# Patient Record
Sex: Female | Born: 1955 | Race: White | Hispanic: No | Marital: Married | Smoking: Former smoker
Health system: Southern US, Community
[De-identification: ages and names within clinical notes are randomized; demographics above are authoritative.]

## PROBLEM LIST (undated history)

## (undated) DIAGNOSIS — I10 Essential (primary) hypertension: Secondary | ICD-10-CM

## (undated) DIAGNOSIS — E785 Hyperlipidemia, unspecified: Secondary | ICD-10-CM

## (undated) DIAGNOSIS — E119 Type 2 diabetes mellitus without complications: Secondary | ICD-10-CM

## (undated) HISTORY — PX: TOTAL VAGINAL HYSTERECTOMY: SHX2548

## (undated) HISTORY — DX: Essential (primary) hypertension: I10

## (undated) HISTORY — PX: CHOLECYSTECTOMY: SHX55

## (undated) HISTORY — DX: Hyperlipidemia, unspecified: E78.5

## (undated) HISTORY — PX: ABDOMINAL HYSTERECTOMY: SHX81

## (undated) HISTORY — DX: Type 2 diabetes mellitus without complications: E11.9

---

## 2000-10-09 ENCOUNTER — Encounter: Payer: Self-pay | Admitting: *Deleted

## 2000-10-09 ENCOUNTER — Emergency Department (HOSPITAL_COMMUNITY): Admission: EM | Admit: 2000-10-09 | Discharge: 2000-10-09 | Payer: Self-pay | Admitting: *Deleted

## 2001-12-03 ENCOUNTER — Encounter: Payer: Self-pay | Admitting: Family Medicine

## 2001-12-03 ENCOUNTER — Ambulatory Visit (HOSPITAL_COMMUNITY): Admission: RE | Admit: 2001-12-03 | Discharge: 2001-12-03 | Payer: Self-pay | Admitting: Family Medicine

## 2002-06-02 ENCOUNTER — Ambulatory Visit (HOSPITAL_COMMUNITY): Admission: RE | Admit: 2002-06-02 | Discharge: 2002-06-02 | Payer: Self-pay | Admitting: Orthopaedic Surgery

## 2002-06-02 ENCOUNTER — Encounter: Payer: Self-pay | Admitting: Orthopaedic Surgery

## 2003-08-11 ENCOUNTER — Ambulatory Visit (HOSPITAL_COMMUNITY): Admission: RE | Admit: 2003-08-11 | Discharge: 2003-08-11 | Payer: Self-pay | Admitting: Family Medicine

## 2004-08-29 ENCOUNTER — Ambulatory Visit (HOSPITAL_COMMUNITY): Admission: RE | Admit: 2004-08-29 | Discharge: 2004-08-29 | Payer: Self-pay | Admitting: Family Medicine

## 2004-09-04 ENCOUNTER — Ambulatory Visit (HOSPITAL_COMMUNITY): Admission: RE | Admit: 2004-09-04 | Discharge: 2004-09-04 | Payer: Self-pay | Admitting: Family Medicine

## 2005-10-16 ENCOUNTER — Ambulatory Visit (HOSPITAL_COMMUNITY): Admission: RE | Admit: 2005-10-16 | Discharge: 2005-10-16 | Payer: Self-pay | Admitting: Family Medicine

## 2006-09-23 ENCOUNTER — Ambulatory Visit: Payer: Self-pay | Admitting: Orthopedic Surgery

## 2006-10-21 ENCOUNTER — Ambulatory Visit (HOSPITAL_COMMUNITY): Admission: RE | Admit: 2006-10-21 | Discharge: 2006-10-21 | Payer: Self-pay | Admitting: Unknown Physician Specialty

## 2007-10-07 DIAGNOSIS — I10 Essential (primary) hypertension: Secondary | ICD-10-CM | POA: Insufficient documentation

## 2007-10-07 DIAGNOSIS — Z8679 Personal history of other diseases of the circulatory system: Secondary | ICD-10-CM

## 2007-10-08 ENCOUNTER — Ambulatory Visit: Payer: Self-pay | Admitting: Orthopedic Surgery

## 2007-10-08 DIAGNOSIS — M7150 Other bursitis, not elsewhere classified, unspecified site: Secondary | ICD-10-CM

## 2007-11-19 ENCOUNTER — Ambulatory Visit (HOSPITAL_COMMUNITY): Admission: RE | Admit: 2007-11-19 | Discharge: 2007-11-19 | Payer: Self-pay | Admitting: Family Medicine

## 2008-12-06 ENCOUNTER — Ambulatory Visit (HOSPITAL_COMMUNITY): Admission: RE | Admit: 2008-12-06 | Discharge: 2008-12-06 | Payer: Self-pay | Admitting: Family Medicine

## 2009-06-28 ENCOUNTER — Ambulatory Visit (HOSPITAL_COMMUNITY): Admission: RE | Admit: 2009-06-28 | Discharge: 2009-06-28 | Payer: Self-pay | Admitting: General Surgery

## 2010-07-08 ENCOUNTER — Encounter: Payer: Self-pay | Admitting: Family Medicine

## 2010-08-25 ENCOUNTER — Other Ambulatory Visit (HOSPITAL_COMMUNITY): Payer: Self-pay | Admitting: Family Medicine

## 2010-08-25 DIAGNOSIS — Z139 Encounter for screening, unspecified: Secondary | ICD-10-CM

## 2010-08-31 ENCOUNTER — Ambulatory Visit (HOSPITAL_COMMUNITY)
Admission: RE | Admit: 2010-08-31 | Discharge: 2010-08-31 | Disposition: A | Discharge status: Home / Self Care | Payer: BC Managed Care – PPO | Source: Ambulatory Visit | Attending: Family Medicine | Admitting: Family Medicine

## 2010-08-31 DIAGNOSIS — Z1231 Encounter for screening mammogram for malignant neoplasm of breast: Secondary | ICD-10-CM | POA: Insufficient documentation

## 2010-08-31 DIAGNOSIS — Z139 Encounter for screening, unspecified: Secondary | ICD-10-CM

## 2010-09-02 LAB — CLOTEST (H. PYLORI), BIOPSY: Helicobacter screen: POSITIVE — AB

## 2011-09-13 ENCOUNTER — Other Ambulatory Visit (HOSPITAL_COMMUNITY): Payer: Self-pay | Admitting: Family Medicine

## 2011-09-13 DIAGNOSIS — Z139 Encounter for screening, unspecified: Secondary | ICD-10-CM

## 2011-10-15 ENCOUNTER — Ambulatory Visit (HOSPITAL_COMMUNITY)
Admission: RE | Admit: 2011-10-15 | Discharge: 2011-10-15 | Disposition: A | Discharge status: Home / Self Care | Payer: BC Managed Care – PPO | Source: Ambulatory Visit | Attending: Family Medicine | Admitting: Family Medicine

## 2011-10-15 DIAGNOSIS — Z139 Encounter for screening, unspecified: Secondary | ICD-10-CM

## 2011-10-15 DIAGNOSIS — Z1231 Encounter for screening mammogram for malignant neoplasm of breast: Secondary | ICD-10-CM | POA: Insufficient documentation

## 2011-11-22 ENCOUNTER — Ambulatory Visit: Payer: BC Managed Care – PPO | Admitting: Orthopedic Surgery

## 2013-03-24 ENCOUNTER — Ambulatory Visit: Payer: BC Managed Care – PPO | Admitting: Urology

## 2013-03-31 ENCOUNTER — Ambulatory Visit (INDEPENDENT_AMBULATORY_CARE_PROVIDER_SITE_OTHER): Payer: BC Managed Care – PPO | Admitting: Urology

## 2013-03-31 ENCOUNTER — Encounter (INDEPENDENT_AMBULATORY_CARE_PROVIDER_SITE_OTHER): Payer: Self-pay

## 2013-03-31 DIAGNOSIS — N390 Urinary tract infection, site not specified: Secondary | ICD-10-CM

## 2013-06-23 ENCOUNTER — Ambulatory Visit: Payer: BC Managed Care – PPO | Admitting: Urology

## 2013-06-30 ENCOUNTER — Ambulatory Visit (INDEPENDENT_AMBULATORY_CARE_PROVIDER_SITE_OTHER): Payer: BC Managed Care – PPO | Admitting: Urology

## 2013-06-30 DIAGNOSIS — N390 Urinary tract infection, site not specified: Secondary | ICD-10-CM

## 2016-10-15 DIAGNOSIS — E663 Overweight: Secondary | ICD-10-CM | POA: Insufficient documentation

## 2016-10-15 DIAGNOSIS — Z6829 Body mass index (BMI) 29.0-29.9, adult: Secondary | ICD-10-CM | POA: Insufficient documentation

## 2016-10-15 DIAGNOSIS — E782 Mixed hyperlipidemia: Secondary | ICD-10-CM | POA: Insufficient documentation

## 2016-10-15 DIAGNOSIS — E781 Pure hyperglyceridemia: Secondary | ICD-10-CM | POA: Insufficient documentation

## 2016-10-16 ENCOUNTER — Ambulatory Visit (INDEPENDENT_AMBULATORY_CARE_PROVIDER_SITE_OTHER): Payer: BLUE CROSS/BLUE SHIELD | Admitting: Cardiology

## 2016-10-16 ENCOUNTER — Encounter: Payer: Self-pay | Admitting: Cardiology

## 2016-10-16 VITALS — BP 154/88 | HR 95 | Ht 66.5 in | Wt 185.0 lb

## 2016-10-16 DIAGNOSIS — Z136 Encounter for screening for cardiovascular disorders: Secondary | ICD-10-CM

## 2016-10-16 DIAGNOSIS — E782 Mixed hyperlipidemia: Secondary | ICD-10-CM | POA: Diagnosis not present

## 2016-10-16 DIAGNOSIS — E119 Type 2 diabetes mellitus without complications: Secondary | ICD-10-CM

## 2016-10-16 NOTE — Progress Notes (Signed)
Clinical Summary Marie Bell is a 61 y.o.female seen as new patient, she is referred by Dr Phillips Odor for hyperlipidemia.   1. Hyperlipidemia 02/2016 Cholesterol: TC 248 HDL 43 LDL  Triglycerides 657 TSH 1.5 09/2016 TC 270 TG 1556 HDL 36 - tried simvastatin with side effects. - she reports has not had fasting labs.    2. DM2 - followed by pcp, last HgbA1c was 7.    Past Medical History:  Diagnosis Date  . DM (diabetes mellitus) (HCC)   . Hyperlipidemia   . Hypertension      Allergies  Allergen Reactions  . Metformin And Related Diarrhea    "extreme" diarrhea  . Simvastatin Other (See Comments)    Leg cramps  . Penicillins   . Augmentin [Amoxicillin-Pot Clavulanate] Rash  . Cyclobenzaprine Palpitations    Body aches,nausea,weakness  . Sulfa Antibiotics Rash     Current Outpatient Prescriptions  Medication Sig Dispense Refill  . aspirin EC 81 MG tablet Take 81 mg by mouth daily.    . bisoprolol-hydrochlorothiazide (ZIAC) 2.5-6.25 MG tablet Take 1 tablet by mouth daily.    Marland Kitchen losartan (COZAAR) 50 MG tablet Take 50 mg by mouth daily.    . metFORMIN (GLUCOPHAGE) 500 MG tablet Take by mouth 3 (three) times daily.     No current facility-administered medications for this visit.      Past Surgical History:  Procedure Laterality Date  . CHOLECYSTECTOMY    . TOTAL VAGINAL HYSTERECTOMY       Allergies  Allergen Reactions  . Metformin And Related Diarrhea    "extreme" diarrhea  . Simvastatin Other (See Comments)    Leg cramps  . Penicillins   . Augmentin [Amoxicillin-Pot Clavulanate] Rash  . Cyclobenzaprine Palpitations    Body aches,nausea,weakness  . Sulfa Antibiotics Rash      Family History  Problem Relation Age of Onset  . Hypertension Mother   . Arthritis Mother   . Liver disease Father   . Alcoholism Father   . Diabetes Maternal Grandmother   . Colon cancer Maternal Grandfather      Social History Marie Bell reports that she has never  smoked. She has never used smokeless tobacco. Marie Bell reports that she does not drink alcohol.   Review of Systems CONSTITUTIONAL: No weight loss, fever, chills, weakness or fatigue.  HEENT: Eyes: No visual loss, blurred vision, double vision or yellow sclerae.No hearing loss, sneezing, congestion, runny nose or sore throat.  SKIN: No rash or itching.  CARDIOVASCULAR: no chest pain , no palpitations.  RESPIRATORY: No shortness of breath, cough or sputum.  GASTROINTESTINAL: No anorexia, nausea, vomiting or diarrhea. No abdominal pain or blood.  GENITOURINARY: No burning on urination, no polyuria NEUROLOGICAL: No headache, dizziness, syncope, paralysis, ataxia, numbness or tingling in the extremities. No change in bowel or bladder control.  MUSCULOSKELETAL: No muscle, back pain, joint pain or stiffness.  LYMPHATICS: No enlarged nodes. No history of splenectomy.  PSYCHIATRIC: No history of depression or anxiety.  ENDOCRINOLOGIC: No reports of sweating, cold or heat intolerance. No polyuria or polydipsia.  Marland Kitchen   Physical Examination Vitals:   10/16/16 1338  BP: (!) 154/88  Pulse: 95   Vitals:   10/16/16 1338  Weight: 185 lb (83.9 kg)  Height: 5' 6.5" (1.689 m)    Gen: resting comfortably, no acute distress HEENT: no scleral icterus, pupils equal round and reactive, no palptable cervical adenopathy,  CV: RRR, no m/r/g, no jvd Resp: Clear to auscultation bilaterally  GI: abdomen is soft, non-tender, non-distended, normal bowel sounds, no hepatosplenomegaly MSK: extremities are warm, no edema.  Skin: warm, no rash Neuro:  no focal deficits Psych: appropriate affect   EKG in clinic today shows NSR.   Assessment and Plan  1. Hyperlipidemia - we will repeat her lipid panel. She reports prior labs have not been fasting. Unclear how severe her hypertriglyceridemia is - pending results, will consider fenofibrate, potentially lovaza in the near future - counseled on dietary and  lifestyle modification   2. DM2 - controlled, continue oral therapy by pcp      Antoine Poche, MD

## 2016-10-16 NOTE — Patient Instructions (Signed)
Medication Instructions:  Your physician recommends that you continue on your current medications as directed. Please refer to the Current Medication list given to you today.   Labwork: Your physician recommends that you return for lab work in: asap Fasting lipid panel   Testing/Procedures: none  Follow-Up: Your physician recommends that you schedule a follow-up appointment in: 2 months    Any Other Special Instructions Will Be Listed Below (If Applicable).     If you need a refill on your cardiac medications before your next appointment, please call your pharmacy.

## 2016-10-22 ENCOUNTER — Other Ambulatory Visit: Payer: Self-pay | Admitting: Cardiology

## 2016-10-23 LAB — LIPID PANEL W/O CHOL/HDL RATIO
Cholesterol, Total: 419 mg/dL — ABNORMAL HIGH (ref 100–199)
HDL: 27 mg/dL — AB (ref >39)
Triglycerides: 2106 mg/dL (ref 0–149)

## 2016-10-29 ENCOUNTER — Telehealth: Payer: Self-pay | Admitting: *Deleted

## 2016-10-29 MED ORDER — FENOFIBRATE 145 MG PO TABS: 145.0000 mg | ORAL_TABLET | Freq: Every day | ORAL | 3 refills | Status: DC

## 2016-10-29 MED ORDER — OMEGA-3-ACID ETHYL ESTERS 1 G PO CAPS: 2.0000 g | ORAL_CAPSULE | Freq: Two times a day (BID) | ORAL | 3 refills | Status: DC

## 2016-10-29 NOTE — Telephone Encounter (Signed)
Patient notified

## 2016-10-29 NOTE — Telephone Encounter (Signed)
-----   Message from Albertine PatriciaStaci T Ashworth, CMA sent at 10/26/2016  3:53 PM EDT -----   ----- Message ----- From: Antoine PocheBranch, Jonathan F, MD Sent: 10/26/2016   3:47 PM To: Staci T Ashworth, CMA  Triglycerides are very high. Please make sure this is a true fasting lab  (no food for at least 8 hours prior, including mild or coffee creamer). If it is true fasting, please start tricor (fenofibrate) 145mg  daily as well as lovaza 2g bid. Why is she on estradiol? Which doctor has been prescirbing this for her? This could be playing some role in her really high TGs and would like to get her off if possible once we get more info about it. Id like to see her back in about 3 weeks to discuss everything.   Dina RichJonathan Branch MD

## 2016-10-29 NOTE — Telephone Encounter (Signed)
Please start new medications as prescribed. I would have her stop the estradiol for now and have her arrange an outpatient f/u with Dr Rana SnareLowe to discuss alternative treatments. I think her very high triglycerides at this time are the priority, at this level she is at risk for something called pancreatitis which we want to avoid. Strongly cut back on fatty foods, sweets, sodas, teas  J Danijela Vessey MD

## 2016-10-29 NOTE — Telephone Encounter (Signed)
Patient currently out of town. She is on Estradiol for vaginal dryness. This was [facility] She reports fasting before labs for 8 1/2 hours.

## 2016-11-21 ENCOUNTER — Telehealth: Payer: Self-pay

## 2016-11-21 NOTE — Telephone Encounter (Signed)
Spoke with patient. She has been taking the midications for about 3 weeks ( fenofibrate & lovaza) with no problems. She will make her follow up appointment soon.

## 2016-11-21 NOTE — Telephone Encounter (Signed)
-----   Message from Antoine PocheJonathan F Branch, MD sent at 11/21/2016  9:46 AM EDT ----- Can we touch base with this patient and make sure she was able to start her fenofibrate and lovaza  Dominga FerryJ Branch MD

## 2016-12-27 ENCOUNTER — Encounter: Payer: Self-pay | Admitting: Cardiology

## 2016-12-27 ENCOUNTER — Ambulatory Visit (INDEPENDENT_AMBULATORY_CARE_PROVIDER_SITE_OTHER): Payer: BLUE CROSS/BLUE SHIELD | Admitting: Cardiology

## 2016-12-27 VITALS — BP 140/82 | HR 74 | Ht 66.0 in | Wt 183.0 lb

## 2016-12-27 DIAGNOSIS — E782 Mixed hyperlipidemia: Secondary | ICD-10-CM | POA: Diagnosis not present

## 2016-12-27 NOTE — Progress Notes (Signed)
Clinical Summary [name] is a 61 y.o.female seen today for follow up of the following medical problems.   1. Hyperlipidemia 02/2016 Cholesterol: TC 248 HDL 43 LDL  Triglycerides 161741 TSH 1.5 - 09/2016 TC 270 TG 1556 HDL 36 - on repeat labs TGs up to 2100. - started on fenofibrate and lovaza, she started taking May 21 - had been on estradiol at the time, she has stopped taking. - working on dietary changes and weight loss  2. DM2 - followed by pcp, last HgbA1c was 7.    SH: [place] Past Medical History:  Diagnosis Date  . DM (diabetes mellitus) (HCC)   . Hyperlipidemia   . Hypertension      Allergies  Allergen Reactions  . Metformin And Related Diarrhea    "extreme" diarrhea  . Simvastatin Other (See Comments)    Leg cramps  . Penicillins   . Augmentin [Amoxicillin-Pot Clavulanate] Rash  . Cyclobenzaprine Palpitations    Body aches,nausea,weakness  . Sulfa Antibiotics Rash     Current Outpatient Prescriptions  Medication Sig Dispense Refill  . aspirin EC 81 MG tablet Take 81 mg by mouth daily.    . bisoprolol-hydrochlorothiazide (ZIAC) 2.5-6.25 MG tablet Take 1 tablet by mouth daily.    Marland Kitchen. estradiol (ESTRACE) 1 MG tablet Take 1 mg by mouth daily.  0  . fenofibrate (TRICOR) 145 MG tablet Take 1 tablet (145 mg total) by mouth daily. 90 tablet 3  . losartan (COZAAR) 50 MG tablet Take 50 mg by mouth daily.    . metFORMIN (GLUCOPHAGE) 500 MG tablet Take by mouth 3 (three) times daily.    Marland Kitchen. omega-3 acid ethyl esters (LOVAZA) 1 g capsule Take 2 capsules (2 g total) by mouth 2 (two) times daily. 180 capsule 3   No current facility-administered medications for this visit.      Past Surgical History:  Procedure Laterality Date  . CHOLECYSTECTOMY    . TOTAL VAGINAL HYSTERECTOMY       Allergies  Allergen Reactions  . Metformin And Related Diarrhea    "extreme" diarrhea  . Simvastatin Other (See Comments)    Leg cramps    . Penicillins   . Augmentin [Amoxicillin-Pot Clavulanate] Rash  . Cyclobenzaprine Palpitations    Body aches,nausea,weakness  . Sulfa Antibiotics Rash      Family History  Problem Relation Age of Onset  . Hypertension Mother   . Arthritis Mother   . Liver disease Father   . Alcoholism Father   . Diabetes Maternal Grandmother   . Colon cancer Maternal Grandfather      Social History [name] reports that she quit smoking about 10 years ago. She has never used smokeless tobacco. [name] reports that she does not drink alcohol.   Review of Systems CONSTITUTIONAL: No weight loss, fever, chills, weakness or fatigue.  HEENT: Eyes: No visual loss, blurred vision, double vision or yellow sclerae.No hearing loss, sneezing, congestion, runny nose or sore throat.  SKIN: No rash or itching.  CARDIOVASCULAR: no chest pain, no palpitations.  RESPIRATORY: No shortness of breath, cough or sputum.  GASTROINTESTINAL: No anorexia, nausea, vomiting or diarrhea. No abdominal pain or blood.  GENITOURINARY: No burning on urination, no polyuria NEUROLOGICAL: No headache, dizziness, syncope, paralysis, ataxia, numbness or tingling in the extremities. No change in bowel or bladder control.  MUSCULOSKELETAL: No muscle, back pain, joint pain or stiffness.  LYMPHATICS: No enlarged nodes. No history of splenectomy.  PSYCHIATRIC: No history of depression or anxiety.  ENDOCRINOLOGIC: No reports of sweating, cold or heat intolerance. No polyuria or polydipsia.  Marland Kitchen   Physical Examination Vitals:   12/27/16 1324  BP: 140/82  Pulse: 74   Vitals:   12/27/16 1324  Weight: 183 lb (83 kg)  Height: 5\' 6"  (1.676 m)    Gen: resting comfortably, no acute distress HEENT: no scleral icterus, pupils equal round and reactive, no palptable cervical adenopathy,  CV: RRR, no m/r/g, no jvd Resp: Clear to auscultation bilaterally GI: abdomen is soft, non-tender, non-distended, normal bowel sounds, no  hepatosplenomegaly MSK: extremities are warm, no edema.  Skin: warm, no rash Neuro:  no focal deficits Psych: appropriate affect   Diagnostic Studies     Assessment and Plan  1. Hyperlipidemia - we will repeat labs - continue current meds       Antoine Poche, M.D.

## 2016-12-27 NOTE — Patient Instructions (Signed)
Your physician wants you to follow-up in:  6 months with Dr Lurena JoinerBranch You will receive a reminder letter in the mail two months in advance. If you don't receive a letter, please call our office to schedule the follow-up appointment.    Your physician recommends that you continue on your current medications as directed. Please refer to the Current Medication list given to you today.     Your physician recommends that you return for lab work in: lipids, tsh     Thank you for choosing [facility] !

## 2017-01-21 ENCOUNTER — Other Ambulatory Visit: Payer: Self-pay | Admitting: Cardiology

## 2017-01-22 LAB — LIPID PANEL W/O CHOL/HDL RATIO
Cholesterol, Total: 197 mg/dL (ref 100–199)
HDL: 49 mg/dL (ref >39)
LDL Calculated: 121 mg/dL — ABNORMAL HIGH (ref 0–99)
Triglycerides: 134 mg/dL (ref 0–149)
VLDL CHOLESTEROL CAL: 27 mg/dL (ref 5–40)

## 2017-01-22 LAB — TSH: TSH: 3.15 u[IU]/mL (ref 0.450–4.500)

## 2017-05-23 ENCOUNTER — Telehealth: Payer: Self-pay | Admitting: Cardiology

## 2017-05-23 NOTE — Telephone Encounter (Signed)
Patient states she will discuss at visit because she is taking half dose of finofibrate and wants to know what her cholesterol is on reduced dose

## 2017-05-23 NOTE — Telephone Encounter (Signed)
Had lipids and TSH in August, do they need more labs now ?

## 2017-05-23 NOTE — Telephone Encounter (Signed)
No repeat labs needed   Dominga FerryJ Kinzie Wickes MD

## 2017-05-23 NOTE — Telephone Encounter (Signed)
Would like to know if she needs labs prior to her apt on 07/19/17, please give her a call [phone]

## 2017-07-19 ENCOUNTER — Ambulatory Visit (INDEPENDENT_AMBULATORY_CARE_PROVIDER_SITE_OTHER): Payer: BLUE CROSS/BLUE SHIELD | Admitting: Cardiology

## 2017-07-19 ENCOUNTER — Encounter: Payer: Self-pay | Admitting: Cardiology

## 2017-07-19 VITALS — BP 148/90 | HR 77 | Ht 66.0 in | Wt 182.0 lb

## 2017-07-19 DIAGNOSIS — E785 Hyperlipidemia, unspecified: Secondary | ICD-10-CM

## 2017-07-19 DIAGNOSIS — I10 Essential (primary) hypertension: Secondary | ICD-10-CM

## 2017-07-19 NOTE — Progress Notes (Signed)
Clinical Summary Marie Bell is a 62 y.o.female seen today for follow up of the following medical problems.   1. Hyperlipidemia 02/2016 Cholesterol: TC 248 HDL 43 LDL Triglycerides 741 TSH 1.5 - 09/2016 TC 270 TG 1556 HDL 36 - on repeat labs TGs up to 2100. - started on fenofibrate and lovaza, she started taking May 21 - had been on estradiol at the time, she has stopped taking. - working on dietary changes and weight loss   - repeat labs 01/2017 with TC 197 TG 134 HDL 49 LDL 121 - patient has cut down her fenofibrate dose since 01/2017. Takes lovaza every 3-4 days.  - side effects on simvastatin in the past   2. DM2 - followed by pcp, last HgbA1c was 7.   3. HTN - home bp's 140s/70s    SH: recent [place]   Past Medical History:  Diagnosis Date  . DM (diabetes mellitus) (HCC)   . Hyperlipidemia   . Hypertension      Allergies  Allergen Reactions  . Metformin And Related Diarrhea    "extreme" diarrhea  . Simvastatin Other (See Comments)    Leg cramps  . Penicillins   . Augmentin [Amoxicillin-Pot Clavulanate] Rash  . Cyclobenzaprine Palpitations    Body aches,nausea,weakness  . Sulfa Antibiotics Rash     Current Outpatient Medications  Medication Sig Dispense Refill  . aspirin EC 81 MG tablet Take 81 mg by mouth daily.    . bisoprolol-hydrochlorothiazide (ZIAC) 2.5-6.25 MG tablet Take 1 tablet by mouth daily.    . fenofibrate (TRICOR) 145 MG tablet Take 1 tablet (145 mg total) by mouth daily. 90 tablet 3  . losartan (COZAAR) 50 MG tablet Take 50 mg by mouth daily.    . metFORMIN (GLUCOPHAGE) 500 MG tablet Take by mouth 3 (three) times daily.    Marland Kitchen. omega-3 acid ethyl esters (LOVAZA) 1 g capsule Take 2 capsules (2 g total) by mouth 2 (two) times daily. 180 capsule 3   No current facility-administered medications for this visit.      Past Surgical History:  Procedure Laterality Date  . CHOLECYSTECTOMY    . TOTAL  VAGINAL HYSTERECTOMY       Allergies  Allergen Reactions  . Metformin And Related Diarrhea    "extreme" diarrhea  . Simvastatin Other (See Comments)    Leg cramps  . Penicillins   . Augmentin [Amoxicillin-Pot Clavulanate] Rash  . Cyclobenzaprine Palpitations    Body aches,nausea,weakness  . Sulfa Antibiotics Rash      Family History  Problem Relation Age of Onset  . Hypertension Mother   . Arthritis Mother   . Liver disease Father   . Alcoholism Father   . Diabetes Maternal Grandmother   . Colon cancer Maternal Grandfather      Social History Marie Bell reports that she quit smoking about 11 years ago. she has never used smokeless tobacco. Marie Bell reports that she does not drink alcohol.   Review of Systems CONSTITUTIONAL: No weight loss, fever, chills, weakness or fatigue.  HEENT: Eyes: No visual loss, blurred vision, double vision or yellow sclerae.No hearing loss, sneezing, congestion, runny nose or sore throat.  SKIN: No rash or itching.  CARDIOVASCULAR: per hpi RESPIRATORY: No shortness of breath, cough or sputum.  GASTROINTESTINAL: No anorexia, nausea, vomiting or diarrhea. No abdominal pain or blood.  GENITOURINARY: No burning on urination, no polyuria NEUROLOGICAL: No headache, dizziness, syncope, paralysis, ataxia, numbness or tingling in  the extremities. No change in bowel or bladder control.  MUSCULOSKELETAL: No muscle, back pain, joint pain or stiffness.  LYMPHATICS: No enlarged nodes. No history of splenectomy.  PSYCHIATRIC: No history of depression or anxiety.  ENDOCRINOLOGIC: No reports of sweating, cold or heat intolerance. No polyuria or polydipsia.  Marland Kitchen   Physical Examination Vitals:   07/19/17 1042  BP: (!) 148/90  Pulse: 77  SpO2: 97%   Vitals:   07/19/17 1042  Weight: 182 lb (82.6 kg)  Height: 5\' 6"  (1.676 m)    Gen: resting comfortably, no acute distress HEENT: no scleral icterus, pupils equal round and reactive, no palptable  cervical adenopathy,  CV: RRR, no m/r/,g no jvd Resp: Clear to auscultation bilaterally GI: abdomen is soft, non-tender, non-distended, normal bowel sounds, no hepatosplenomegaly MSK: extremities are warm, no edema.  Skin: warm, no rash Neuro:  no focal deficits Psych: appropriate affect     Assessment and Plan  1. Hyperlipidemia - TGs significantly improved with medical therapy. She has cut down her meds on her own - we will repeat lipid panel and f/u TGs. If still controlled d/c fenofibrate, would continue lovaza - due to DM2 would recommend a statin. Pending lipid panel results, likely would start atorva 40mg  daily.  2. HTN - elevated in clinic, follow trends with continued lifestyle modificatoin. May need titration of meds if remains elevated    Antoine Poche, M.D.

## 2017-07-19 NOTE — Patient Instructions (Signed)
Medication Instructions:  Your physician recommends that you continue on your current medications as directed. Please refer to the Current Medication list given to you today.    Labwork: NONE  Testing/Procedures: NONE  Follow-Up: Your physician recommends that you schedule a follow-up appointment in: To be determined    Any Other Special Instructions Will Be Listed Below (If Applicable).     If you need a refill on your cardiac medications before your next appointment, please call your pharmacy.

## 2017-07-22 ENCOUNTER — Encounter: Payer: Self-pay | Admitting: Cardiology

## 2017-07-29 ENCOUNTER — Telehealth: Payer: Self-pay | Admitting: Cardiology

## 2017-07-29 NOTE — Telephone Encounter (Signed)
Results faxed from [facility]. And placed on Dr. Verna CzechBranch's desk for review.

## 2017-07-31 ENCOUNTER — Telehealth: Payer: Self-pay | Admitting: Cardiovascular Disease

## 2017-07-31 NOTE — Telephone Encounter (Signed)
Returned pt call. Left detailed message on pt's private voicemail. Will call pt oce labs are resulted to me.

## 2017-07-31 NOTE — Telephone Encounter (Signed)
Lab work results / tg  °

## 2017-08-02 MED ORDER — ATORVASTATIN CALCIUM 40 MG PO TABS: 40.0000 mg | ORAL_TABLET | Freq: Every day | ORAL | 3 refills | Status: DC

## 2017-08-02 NOTE — Telephone Encounter (Signed)
-----   Message from Antoine PocheJonathan F Branch, MD sent at 08/02/2017  1:55 PM EST ----- Labs show her triglycerides are mildly elevated at 176. LDL is mildly elevated at 829100. Her diabtes is not well controlled, her HgbA1c is 8.2, please forward to her pcp. Ok to stop fenofibrate and lovaza, but needs to start atorvastatin 40mg  daily. Apologize for delay but I have been in the hospital all week. F/u 6 months   Dina RichJonathan Branch MD

## 2017-08-02 NOTE — Telephone Encounter (Signed)
Called pt to let her know her lab results. She voiced understanding. Sent in RX for Atorvastatin 40 mg daily. Send a copy of labs to pcp.

## 2017-08-02 NOTE — Telephone Encounter (Signed)
Please give pt a call on [phone] concerning lab results

## 2017-08-02 NOTE — Telephone Encounter (Signed)
Returned pt call. She is still waiting to hear of lab results. Advised pt I will Contact her by end of day.

## 2019-03-12 ENCOUNTER — Other Ambulatory Visit: Payer: Self-pay

## 2019-03-12 NOTE — Progress Notes (Signed)
Name: Marie Bell  MRN/ DOB: [id number], May 06, 1956   Age/ Sex: 63 y.o., female    PCP: Sharilyn Sites, MD   Reason for Endocrinology Evaluation: Type 2 Diabetes Mellitus     Date of Initial Endocrinology Visit: 03/16/2019     PATIENT IDENTIFIER: Marie Bell is a 63 y.o. female with a past medical history of HTN, T2Dm and Dyslipidemia. The patient presented for initial endocrinology clinic visit on 03/16/2019 for consultative assistance with her diabetes management.    HPI: Marie Bell was    Diagnosed with T2DM > 8 yrs ago  Prior Medications tried/Intolerance: Janumet , januvia , linagliptin  Currently checking blood sugars 1 x / day,  before breakfast . Hypoglycemia episodes:  no              Hemoglobin A1c has ranged from 6.0% , peaking at 9.0%.   Patient required assistance for hypoglycemia: no Patient has required hospitalization within the last 1 year from hyper or hypoglycemia: no  In terms of diet, the patient eats 3 meals a day, snacks on occasions. The pt avoids sugar-sweetened beverages.    HOME DIABETES REGIMEN: Metformin 1000 mg BID  Glimepiride 2 mg BID   Statin: yes ACE-I/ARB: yes Prior Diabetic Education: no   METER DOWNLOAD SUMMARY: Did not bring    DIABETIC COMPLICATIONS: Microvascular complications:   ? Right eye retinopathy- pt states this is not related to DM.   Denies: neuropathy, CKD   Last eye exam: Completed 2020  Macrovascular complications:    Denies: CAD, PVD, CVA   PAST HISTORY: Past Medical History:  Past Medical History:  Diagnosis Date  . DM (diabetes mellitus) ([place])   . Hyperlipidemia   . Hypertension    Past Surgical History:  Past Surgical History:  Procedure Laterality Date  . CHOLECYSTECTOMY    . TOTAL VAGINAL HYSTERECTOMY        Social History:  reports that she quit smoking about 12 years ago. Her smoking use included e-cigarettes. She has never used smokeless tobacco. She reports that she does not  drink alcohol or use drugs. Family History:  Family History  Problem Relation Age of Onset  . Hypertension Mother   . Arthritis Mother   . Liver disease Father   . Alcoholism Father   . Diabetes Maternal Grandmother   . Colon cancer Maternal Grandfather      HOME MEDICATIONS: Allergies as of 03/16/2019      Reactions   Metformin And Related Diarrhea   "extreme" diarrhea   Simvastatin Other (See Comments)   Leg cramps   Penicillins    Augmentin [amoxicillin-pot Clavulanate] Rash   Cyclobenzaprine Palpitations   Body aches,nausea,weakness   Sulfa Antibiotics Rash      Medication List       Accurate as of March 16, 2019 10:51 AM. If you have any questions, ask your nurse or doctor.        aspirin EC 81 MG tablet Take 81 mg by mouth daily.   atorvastatin 40 MG tablet Commonly known as: LIPITOR Take 1 tablet (40 mg total) by mouth daily.   bisoprolol-hydrochlorothiazide 2.5-6.25 MG tablet Commonly known as: ZIAC Take 1 tablet by mouth daily.   Contour Next One Kit by Does not apply route.   estradiol 0.1 MG/24HR patch Commonly known as: VIVELLE-DOT APP 1 PA EXT TO THE SKIN 2 TIMES A WK UTD   glimepiride 2 MG tablet Commonly known as: AMARYL   losartan 50 MG  tablet Commonly known as: COZAAR Take 50 mg by mouth daily.   metFORMIN 500 MG tablet Commonly known as: GLUCOPHAGE Take by mouth 3 (three) times daily.         ALLERGIES: Allergies  Allergen Reactions  . Metformin And Related Diarrhea    "extreme" diarrhea  . Simvastatin Other (See Comments)    Leg cramps  . Penicillins   . Augmentin [Amoxicillin-Pot Clavulanate] Rash  . Cyclobenzaprine Palpitations    Body aches,nausea,weakness  . Sulfa Antibiotics Rash     REVIEW OF SYSTEMS: A comprehensive ROS was conducted with the patient and is negative except as per HPI and below:  Review of Systems  Constitutional: Negative for fever and weight loss.  HENT: Negative for congestion and  sore throat.   Eyes: Positive for blurred vision. Negative for pain.  Respiratory: Negative for cough and shortness of breath.   Cardiovascular: Negative for chest pain and palpitations.  Gastrointestinal: Negative for diarrhea and nausea.  Genitourinary: Negative for frequency.  Skin: Negative.   Neurological: Positive for tingling. Negative for tremors.  Endo/Heme/Allergies: Negative for polydipsia.  Psychiatric/Behavioral: Negative for depression. The patient is not nervous/anxious.       OBJECTIVE:   VITAL SIGNS: BP 132/84 (BP Location: Right Arm, Patient Position: Sitting, Cuff Size: Large)   Pulse 79   Temp 98.8 F (37.1 C)   Ht 5' 7" (1.702 m)   Wt 187 lb 12.8 oz (85.2 kg)   SpO2 98%   BMI 29.41 kg/m    PHYSICAL EXAM:  General: Pt appears well and is in NAD  Hydration: Well-hydrated with moist mucous membranes and good skin turgor  HEENT: Head: Unremarkable with good dentition. Oropharynx clear without exudate.  Eyes: External eye exam normal without stare, lid lag or exophthalmos.  EOM intact.   Neck: General: Supple without adenopathy or carotid bruits. Thyroid: Thyroid size normal.  No goiter or nodules appreciated. No thyroid bruit.  Lungs: Clear with good BS bilat with no rales, rhonchi, or wheezes  Heart: RRR with normal S1 and S2 and no gallops; no murmurs; no rub  Abdomen: Normoactive bowel sounds, soft, nontender, without masses or organomegaly palpable  Extremities:  Lower extremities - No pretibial edema. No lesions.  Skin: Normal texture and temperature to palpation. No rash noted.   Neuro: MS is good with appropriate affect, pt is alert and Ox3    DM foot exam: 03/16/2019  The skin of the feet is intact without sores or ulcerations. The pedal pulses are 2+ on right and 2+ on left. The sensation is intact to a screening 5.07, 10 gram monofilament bilaterally   DATA REVIEWED: 12/09/18   A1c 8.2% BUN/Cr 9/0.620 TG 1478 mg  T. Chol 270 HDL 35    ASSESSMENT / PLAN / RECOMMENDATIONS:   1) Type 2 Diabetes Mellitus, Sub-Optimally controlled, Without complications - Most recent A1c of 7.3 %. Goal A1c < 7.0 %.   Plan: GENERAL: I have discussed with the patient the pathophysiology of diabetes. We went over the natural progression of the disease. We talked about both insulin resistance and insulin deficiency. We stressed the importance of lifestyle changes including diet and exercise. I explained the complications associated with diabetes including retinopathy, nephropathy, neuropathy as well as increased risk of cardiovascular disease. We went over the benefit seen with glycemic control.    I explained to the patient that diabetic patients are at higher than normal risk for amputations.  Pt admits to dietary indiscretions and is not  sure she can stay on a certain diet   She initially declined a referral to a CDE but eventually agreed to seeing one in [place].   She is not taking Januvia or Linagliptin due to cost. Pt is not a candidate for DPP_4 inhibitors nor GLP-1 agonists due to increased risk of pancreatitis given her high TG.   MEDICATIONS:  - Stop Glimepiride  - Start Glipizide 5 mg, 1 tablet before breakfast and Supper - Continue Metformin 1000 mg twice daily with meals   EDUCATION / INSTRUCTIONS:  BG monitoring instructions: Patient is instructed to check her blood sugars 2 times a day, fasting and supper time.  Call [place] [facility] clinic if: BG persistently < 70 or > 300. . I reviewed the Rule of 15 for the treatment of hypoglycemia in detail with the patient. Literature supplied.   2) Diabetic complications:   Eye: Does not have known diabetic retinopathy. Pt states she has some hereditary eye condition that is not related to DM ?  Neuro/ Feet: Does not have known diabetic peripheral neuropathy.  Renal: Patient does not have known baseline CKD. She is on an ACEI/ARB at present She declined a  urine  albumin/creatinine ratio check today . I explained to her this needs to be done annually.   3) Hypertriglyceridemia: Patient is on a statin. She initially told me her lipid panel used to be abnormal but has since been at goal, when I pulled her lipid profile from 11/2018 we discussed her extremely high TG of 1478 mg, then she stated that she stopped taking the statins last year but has since been back on them. We discussed the importance of compliance with lipid lowering therapy, low CHO, low fat intake. We also discussed her VERY HIGH risk for pancreatitis with such readings, we discussed that pancreatitis could be fatal.     4) Hypertension: She is  at goal of < 140/90 mmHg.     F/u in 3 months   Signed electronically by: Mack Guise, MD  [facility]  [facility] [address] [place], [place] [address] Phone: [phone] FAX: [phone]   CC: Sharilyn Sites, [place] [address] Phone: [phone]  Fax: [phone]    Return to Endocrinology clinic as below: No future appointments.

## 2019-03-16 ENCOUNTER — Encounter: Payer: Self-pay | Admitting: Internal Medicine

## 2019-03-16 ENCOUNTER — Ambulatory Visit: Payer: BLUE CROSS/BLUE SHIELD | Admitting: Internal Medicine

## 2019-03-16 ENCOUNTER — Other Ambulatory Visit: Payer: Self-pay

## 2019-03-16 VITALS — BP 132/84 | HR 79 | Temp 98.8°F | Ht 67.0 in | Wt 187.8 lb

## 2019-03-16 DIAGNOSIS — E1165 Type 2 diabetes mellitus with hyperglycemia: Secondary | ICD-10-CM

## 2019-03-16 DIAGNOSIS — R739 Hyperglycemia, unspecified: Secondary | ICD-10-CM

## 2019-03-16 DIAGNOSIS — E781 Pure hyperglyceridemia: Secondary | ICD-10-CM

## 2019-03-16 LAB — POCT GLYCOSYLATED HEMOGLOBIN (HGB A1C): Hemoglobin A1C: 7.3 % — AB (ref 4.0–5.6)

## 2019-03-16 LAB — GLUCOSE, POCT (MANUAL RESULT ENTRY): POC Glucose: 220 mg/dl — AB (ref 70–99)

## 2019-03-16 MED ORDER — GLIPIZIDE 5 MG PO TABS: 5.0000 mg | ORAL_TABLET | Freq: Two times a day (BID) | ORAL | 3 refills | Status: DC

## 2019-03-16 NOTE — Patient Instructions (Addendum)
-   Stop Glimepiride  - Start Glipizide 5 mg, 1 tablet before breakfast and Supper - Continue Metformin 1000 mg twice daily with meals    - Check sugar before breakfast and supper when you can, bring meter on next visit     HOW TO TREAT LOW BLOOD SUGARS (Blood sugar LESS THAN 70 MG/DL)  Please follow the RULE OF 15 for the treatment of hypoglycemia treatment (when your (blood sugars are less than 70 mg/dL)    STEP 1: Take 15 grams of carbohydrates when your blood sugar is low, which includes:   3-4 GLUCOSE TABS  OR  3-4 OZ OF JUICE OR REGULAR SODA OR  ONE TUBE OF GLUCOSE GEL     STEP 2: RECHECK blood sugar in 15 MINUTES STEP 3: If your blood sugar is still low at the 15 minute recheck --> then, go back to STEP 1 and treat AGAIN with another 15 grams of carbohydrates.

## 2019-04-13 ENCOUNTER — Encounter: Payer: Self-pay | Admitting: Nutrition

## 2019-04-13 ENCOUNTER — Other Ambulatory Visit: Payer: Self-pay

## 2019-04-13 ENCOUNTER — Encounter: Payer: PRIVATE HEALTH INSURANCE | Attending: Family Medicine | Admitting: Nutrition

## 2019-04-13 VITALS — Ht 66.0 in | Wt 188.0 lb

## 2019-04-13 DIAGNOSIS — E782 Mixed hyperlipidemia: Secondary | ICD-10-CM | POA: Insufficient documentation

## 2019-04-13 DIAGNOSIS — E663 Overweight: Secondary | ICD-10-CM | POA: Insufficient documentation

## 2019-04-13 DIAGNOSIS — E781 Pure hyperglyceridemia: Secondary | ICD-10-CM | POA: Insufficient documentation

## 2019-04-13 NOTE — Patient Instructions (Addendum)
Goals  Follow My Plate  Eat meals on time Watch portions Increase lower carb vegetables Eat only veggies between meals Get A1C down to 6.5% Lose 10 lbs in 3 months.

## 2019-04-13 NOTE — Progress Notes (Signed)
  Medical Nutrition Therapy:  Appt start time: 1601 end time:  1630.   Assessment:  Primary concerns today:  Diabetes Type 2. Dx 10 yrs ago. LIves with her husband. Eats 2-3 meals per day. Admits to eating later at night. 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  Sees Dr. Kelton Pillar, Endocrinology.  Elevated TG . Has been put on Lipitor.  On Metformin 1000 mg BID. Just started on Glipizide 5 mg BID.  BS are less than 130 's in am.  Lipid Panel  .   Lab Results  Component Value Date   HGBA1C 7.3 (A) 03/16/2019      Preferred Learning Style:    No preference indicated   Learning Readiness:     Ready  Change in progress   MEDICATIONS:    DIETARY INTAKE:   Eats 2-3 meals per day. Admits to eating later at night.   Usual physical activity: walks   Estimated energy needs: 1200 calories 135 g carbohydrates 90 g protein 33 g fat  Progress Towards Goal(s):  In progress.   Nutritional Diagnosis:  NB-1.1 Food and nutrition-related knowledge deficit As related to Diabetes.  As evidenced by A1C 7.3%.    Intervention:  Nutrition and Diabetes education provided on My Plate, CHO counting, meal planning, portion sizes, timing of meals, avoiding snacks between meals unless having a low blood sugar, target ranges for A1C and blood sugars, signs/symptoms and treatment of hyper/hypoglycemia, monitoring blood sugars, taking medications as prescribed, benefits of exercising 30 minutes per day and prevention of complications of DM.  Marland KitchenGoals  Follow My Plate  Eat meals on time Watch portions Increase lower carb vegetables Eat only veggies between meals Get A1C down to 6.5% Lose 10 lbs in 3 months.   Teaching Method Utilized:  Visual Auditory Hands on  Handouts given during visit include:  The Plate Method  Meal Plan Card  Diabetes Instructions   Barriers to learning/adherence to lifestyle change: none  Demonstrated degree of understanding via:  Teach  Back   Monitoring/Evaluation:  Dietary intake, exercise, , and body weight in 3 month(s).

## 2019-06-04 ENCOUNTER — Other Ambulatory Visit: Payer: Self-pay

## 2019-06-08 ENCOUNTER — Encounter: Payer: Self-pay | Admitting: Internal Medicine

## 2019-06-08 ENCOUNTER — Ambulatory Visit: Payer: PRIVATE HEALTH INSURANCE | Admitting: Internal Medicine

## 2019-06-08 ENCOUNTER — Other Ambulatory Visit: Payer: Self-pay

## 2019-06-08 VITALS — BP 144/88 | HR 78 | Temp 98.6°F | Ht 66.0 in | Wt 184.8 lb

## 2019-06-08 DIAGNOSIS — E1165 Type 2 diabetes mellitus with hyperglycemia: Secondary | ICD-10-CM | POA: Diagnosis not present

## 2019-06-08 LAB — POCT GLYCOSYLATED HEMOGLOBIN (HGB A1C): Hemoglobin A1C: 6.8 % — AB (ref 4.0–5.6)

## 2019-06-08 NOTE — Progress Notes (Signed)
Name: Marie Bell  Age/ Sex: 63 y.o., female   MRN/ DOB: [id number], 07-05-1955     PCP: Sharilyn Sites, MD   Reason for Endocrinology Evaluation: Type 2 Diabetes Mellitus  Initial Endocrine Consultative Visit: 03/16/2019    PATIENT IDENTIFIER: Marie Bell is a 63 y.o. female with a past medical history of HTN, T2Dm and Dyslipidemia . The patient has followed with Endocrinology clinic since 03/16/2019 for consultative assistance with management of her diabetes.  DIABETIC HISTORY:  Marie Bell was diagnosed with DM ~ 2012. Has been prescribed  Janumet, januvia and Linagliptin but unable to use due to high cost.  Her hemoglobin A1c has ranged from 6.0% , peaking at 9.0%.    On her initial visit to our clinic she had an A1c of 7.3% on Metformin and Glimepiride. We switched Glimepiride with glipizide and continued Metformin   SUBJECTIVE:   During the last visit (03/16/2019): A1c of 7.3% .We switched Glimepiride with glipizide and continued Metformin   Today (06/08/2019): Marie Bell is here for a 3 month follow up On diabetes.  She checks her blood sugars 2 times daily, preprandial to breakfast and dinner. The patient has not had hypoglycemic episodes since the last clinic visit. Otherwise, the patient has not required any recent emergency interventions for hypoglycemia and has not had recent hospitalizations secondary to hyper or hypoglycemic episodes.    ROS: As per HPI and as detailed below: Review of Systems  Respiratory: Negative for cough and shortness of breath.   Cardiovascular: Negative for chest pain and palpitations.  Gastrointestinal: Negative for diarrhea and nausea.      HOME DIABETES REGIMEN:  Glipizide 5 mg BID Metformin 1000 mg BID     METER DOWNLOAD SUMMARY: Date range evaluated: 11/22-12/21/2020 Fingerstick Blood Glucose Tests = 42 Average Number Tests/Day = 1.4 Overall Mean FS Glucose = 158 Standard Deviation = 25  BG Ranges: Low = 112 High =  251   Hypoglycemic Events/30 Days: BG < 50 = 0 Episodes of symptomatic severe hypoglycemia = 0     HISTORY:  Past Medical History:  Past Medical History:  Diagnosis Date  . DM (diabetes mellitus) ([place])   . Hyperlipidemia   . Hypertension    Past Surgical History:  Past Surgical History:  Procedure Laterality Date  . CHOLECYSTECTOMY    . TOTAL VAGINAL HYSTERECTOMY      Social History:  reports that she quit smoking about 12 years ago. Her smoking use included e-cigarettes. She has never used smokeless tobacco. She reports that she does not drink alcohol or use drugs. Family History:  Family History  Problem Relation Age of Onset  . Hypertension Mother   . Arthritis Mother   . Liver disease Father   . Alcoholism Father   . Diabetes Maternal Grandmother   . Colon cancer Maternal Grandfather      HOME MEDICATIONS: Allergies as of 06/08/2019      Reactions   Metformin And Related Diarrhea   "extreme" diarrhea   Simvastatin Other (See Comments)   Leg cramps   Penicillins    Augmentin [amoxicillin-pot Clavulanate] Rash   Cyclobenzaprine Palpitations   Body aches,nausea,weakness   Sulfa Antibiotics Rash      Medication List       Accurate as of June 08, 2019 12:22 PM. If you have any questions, ask your nurse or doctor.        aspirin EC 81 MG tablet Take 81 mg by mouth daily.  atorvastatin 40 MG tablet Commonly known as: LIPITOR Take 1 tablet (40 mg total) by mouth daily.   bisoprolol-hydrochlorothiazide 2.5-6.25 MG tablet Commonly known as: ZIAC Take 1 tablet by mouth daily.   Contour Next One Kit by Does not apply route.   estradiol 0.1 MG/24HR patch Commonly known as: VIVELLE-DOT APP 1 PA EXT TO THE SKIN 2 TIMES A WK UTD   glipiZIDE 5 MG tablet Commonly known as: GLUCOTROL Take 1 tablet (5 mg total) by mouth 2 (two) times daily before a meal.   losartan 50 MG tablet Commonly known as: COZAAR Take 50 mg by mouth daily.   metFORMIN  500 MG tablet Commonly known as: GLUCOPHAGE Take by mouth 3 (three) times daily.        OBJECTIVE:   Vital Signs: BP (!) 144/88 (BP Location: Left Arm, Patient Position: Sitting, Cuff Size: Large)   Pulse 78   Temp 98.6 F (37 C)   Ht '5\' 6"'  (1.676 m)   Wt 184 lb 12.8 oz (83.8 kg)   SpO2 96%   BMI 29.83 kg/m   Wt Readings from Last 3 Encounters:  06/08/19 184 lb 12.8 oz (83.8 kg)  04/13/19 188 lb (85.3 kg)  03/16/19 187 lb 12.8 oz (85.2 kg)     Exam: General: Pt appears well and is in NAD  Lungs: Clear with good BS bilat with no rales, rhonchi, or wheezes  Heart: RRR with normal S1 and S2 and no gallops; no murmurs; no rub  Abdomen: Normoactive bowel sounds, soft, nontender, without masses or organomegaly palpable  Extremities: No pretibial edema.  Skin: Normal texture and temperature to palpation. No rash noted. No Acanthosis nigricans/skin tags. No lipohypertrophy.  Neuro: MS is good with appropriate affect, pt is alert and Ox3       DM foot exam: 03/16/2019  The skin of the feet is intact without sores or ulcerations. The pedal pulses are 2+ on right and 2+ on left. The sensation is intact to a screening 5.07, 10 gram monofilament bilaterally    DATA REVIEWED:  Lab Results  Component Value Date   HGBA1C 6.8 (A) 06/08/2019   HGBA1C 7.3 (A) 03/16/2019      12/09/18   A1c 8.2% BUN/Cr 9/0.620 TG 1478 mg  T. Chol 270 HDL 35    ASSESSMENT / PLAN / RECOMMENDATIONS:   1) Type 2 Diabetes Mellitus, Optimally controlled, Without complications - Most recent A1c of 6.8 %. Goal A1c < 7.0 %.      Despite her reported dietary indiscretions , her BG's for the most part have been acceptable.   I have praised the patient on weight loss and glucose control   Pt is not a candidate for DPP-4 inhibitors nor GLP-1 agonists due to increased risk of pancreatitis given her high TG.   No changes will be made today   MEDICATIONS: - Continue Glipizide 5 mg, 1  tablet before breakfast and Supper - Continue Metformin 1000 mg twice daily with meals   EDUCATION / INSTRUCTIONS:  BG monitoring instructions: Patient is instructed to check her blood sugars 2 times a day, fasting and supper.  Call [place] [facility] clinic if: BG persistently < 70 or > 300. . I reviewed the Rule of 15 for the treatment of hypoglycemia in detail with the patient. Literature supplied.     F/U in 4 months    Signed electronically by: Mack Guise, MD  [facility]  [facility] [address] [place]  [place], [place] [address] Phone: [phone] FAX: [phone]   CC: Sharilyn Sites, [place] [address] Phone: [phone]  Fax: [phone]  Return to Endocrinology clinic as below: Future Appointments  Date Time Provider [place]  06/30/2019  2:00 PM Arlana Lindau, RD [facility] None  10/12/2019 10:30 AM Shreyan Hinz, Melanie Crazier, MD [facility] None

## 2019-06-08 NOTE — Patient Instructions (Signed)
-   Continue Glipizide 5 mg, 1 tablet before breakfast and Supper - Continue Metformin 1000 mg twice daily with meals        HOW TO TREAT LOW BLOOD SUGARS (Blood sugar LESS THAN 70 MG/DL)  Please follow the RULE OF 15 for the treatment of hypoglycemia treatment (when your (blood sugars are less than 70 mg/dL)    STEP 1: Take 15 grams of carbohydrates when your blood sugar is low, which includes:   3-4 GLUCOSE TABS  OR  3-4 OZ OF JUICE OR REGULAR SODA OR  ONE TUBE OF GLUCOSE GEL     STEP 2: RECHECK blood sugar in 15 MINUTES STEP 3: If your blood sugar is still low at the 15 minute recheck --> then, go back to STEP 1 and treat AGAIN with another 15 grams of carbohydrates.   

## 2019-06-15 ENCOUNTER — Ambulatory Visit: Payer: Self-pay | Admitting: Internal Medicine

## 2019-06-30 ENCOUNTER — Ambulatory Visit: Payer: PRIVATE HEALTH INSURANCE | Admitting: Nutrition

## 2019-07-13 ENCOUNTER — Ambulatory Visit: Payer: PRIVATE HEALTH INSURANCE | Attending: Internal Medicine

## 2019-07-13 ENCOUNTER — Other Ambulatory Visit: Payer: Self-pay

## 2019-07-13 DIAGNOSIS — Z20822 Contact with and (suspected) exposure to covid-19: Secondary | ICD-10-CM

## 2019-07-14 ENCOUNTER — Ambulatory Visit: Payer: Self-pay

## 2019-07-14 LAB — NOVEL CORONAVIRUS, NAA: SARS-CoV-2, NAA: DETECTED — AB

## 2019-07-14 NOTE — Telephone Encounter (Signed)
Pt,  Calling for covid -19 results.  Not available voiced understanding

## 2019-07-15 ENCOUNTER — Telehealth: Payer: Self-pay | Admitting: Internal Medicine

## 2019-07-15 NOTE — Telephone Encounter (Signed)
  I connected by phone with Marie Bell on 07/15/2019 at 10:49 AM to discuss the potential use of an new treatment for mild to moderate COVID-19 viral infection in non-hospitalized patients.  This patient is a 64 y.o. female that meets the FDA criteria for Emergency Use Authorization of bamlanivimab or casirivimab\imdevimab.  Has a (+) direct SARS-CoV-2 viral test result  Has mild or moderate COVID-19   Is ? 64 years of age and weighs ? 40 kg  Is NOT hospitalized due to COVID-19  Is NOT requiring oxygen therapy or requiring an increase in baseline oxygen flow rate due to COVID-19  Is within 10 days of symptom onset  Has at least one of the high risk factor(s) for progression to severe COVID-19 and/or hospitalization as defined in EUA.  Specific high risk criteria : hx of DM and >55 c htn  Pt wishes to consider infusion and gather some more information. Her symptom onset was 1/22 and last day to qualify for infusion would be 1/31. She verifies understanding that sooner is better. Number to infusion clinic given should she have any questions and/or decide to proceed.    Cyndee Brightly, NP-C [facility]  pgr [phone]

## 2019-08-05 ENCOUNTER — Other Ambulatory Visit: Payer: Self-pay

## 2019-08-05 ENCOUNTER — Ambulatory Visit: Payer: PRIVATE HEALTH INSURANCE | Attending: Internal Medicine

## 2019-08-05 DIAGNOSIS — Z20822 Contact with and (suspected) exposure to covid-19: Secondary | ICD-10-CM

## 2019-08-06 LAB — NOVEL CORONAVIRUS, NAA: SARS-CoV-2, NAA: NOT DETECTED

## 2019-08-07 ENCOUNTER — Telehealth: Payer: Self-pay

## 2019-08-07 NOTE — Telephone Encounter (Signed)
Pt. Given COVID 19 results, verbalizes understanding. 

## 2019-10-12 ENCOUNTER — Other Ambulatory Visit: Payer: Self-pay

## 2019-10-12 ENCOUNTER — Ambulatory Visit: Payer: PRIVATE HEALTH INSURANCE | Admitting: Internal Medicine

## 2019-10-12 ENCOUNTER — Encounter: Payer: Self-pay | Admitting: Internal Medicine

## 2019-10-12 VITALS — BP 146/82 | HR 81 | Temp 98.3°F | Ht 66.0 in | Wt 184.6 lb

## 2019-10-12 DIAGNOSIS — E781 Pure hyperglyceridemia: Secondary | ICD-10-CM | POA: Insufficient documentation

## 2019-10-12 DIAGNOSIS — E1165 Type 2 diabetes mellitus with hyperglycemia: Secondary | ICD-10-CM | POA: Diagnosis not present

## 2019-10-12 LAB — POCT GLYCOSYLATED HEMOGLOBIN (HGB A1C): Hemoglobin A1C: 7.2 % — AB (ref 4.0–5.6)

## 2019-10-12 MED ORDER — GLIPIZIDE 5 MG PO TABS: 5.0000 mg | ORAL_TABLET | Freq: Two times a day (BID) | ORAL | 3 refills | Status: DC

## 2019-10-12 MED ORDER — METFORMIN HCL 1000 MG PO TABS: 1000.0000 mg | ORAL_TABLET | Freq: Two times a day (BID) | ORAL | 3 refills | Status: DC

## 2019-10-12 NOTE — Patient Instructions (Signed)
-   Continue Glipizide 5 mg, 1 tablet before breakfast and Supper - Continue Metformin 1000 mg twice daily with meals        HOW TO TREAT LOW BLOOD SUGARS (Blood sugar LESS THAN 70 MG/DL)  Please follow the RULE OF 15 for the treatment of hypoglycemia treatment (when your (blood sugars are less than 70 mg/dL)    STEP 1: Take 15 grams of carbohydrates when your blood sugar is low, which includes:   3-4 GLUCOSE TABS  OR  3-4 OZ OF JUICE OR REGULAR SODA OR  ONE TUBE OF GLUCOSE GEL     STEP 2: RECHECK blood sugar in 15 MINUTES STEP 3: If your blood sugar is still low at the 15 minute recheck --> then, go back to STEP 1 and treat AGAIN with another 15 grams of carbohydrates.

## 2019-10-12 NOTE — Progress Notes (Signed)
Name: Marie Bell  Age/ Sex: 64 y.o., female   MRN/ DOB: [id number], June 06, 1956     PCP: Sharilyn Sites, MD   Reason for Endocrinology Evaluation: Type 2 Diabetes Mellitus  Initial Endocrine Consultative Visit: 03/16/2019    PATIENT IDENTIFIER: Marie Bell is a 64 y.o. female with a past medical history of HTN, T2Dm and Dyslipidemia . The patient has followed with Endocrinology clinic since 03/16/2019 for consultative assistance with management of her diabetes.  DIABETIC HISTORY:  Marie Bell was diagnosed with DM ~ 2012. Has been prescribed  Janumet, januvia and Linagliptin but unable to use due to high cost.  Her hemoglobin A1c has ranged from 6.0% , peaking at 9.0%.    On her initial visit to our clinic she had an A1c of 7.3% on Metformin and Glimepiride. We switched Glimepiride with glipizide and continued Metformin   SUBJECTIVE:   During the last visit (06/08/2019): A1c of 6.8 % .We continued glipizide and Metformin   Today (10/12/2019): Marie Bell is here for a 3 month follow up On diabetes.  She checks her blood sugars 1 times daily, preprandial to breakfast. The patient has not had hypoglycemic episodes since the last clinic visit. Otherwise, the patient has not required any recent emergency interventions for hypoglycemia and has not had recent hospitalizations secondary to hyper or hypoglycemic episodes.       ROS: As per HPI and as detailed below: Review of Systems  HENT: Positive for congestion.   Respiratory: Positive for cough. Negative for shortness of breath.   Gastrointestinal: Positive for diarrhea. Negative for nausea.      HOME DIABETES REGIMEN:  Glipizide 5 mg BID Metformin 1000 mg BID     METER DOWNLOAD SUMMARY: Did not bring  Fasting BG's in the past 3 days where around 175 mg/DL   HISTORY:  Past Medical History:  Past Medical History:  Diagnosis Date  . DM (diabetes mellitus) ([place])   . Hyperlipidemia   . Hypertension    Past  Surgical History:  Past Surgical History:  Procedure Laterality Date  . CHOLECYSTECTOMY    . TOTAL VAGINAL HYSTERECTOMY      Social History:  reports that she quit smoking about 13 years ago. Her smoking use included e-cigarettes. She has never used smokeless tobacco. She reports that she does not drink alcohol or use drugs. Family History:  Family History  Problem Relation Age of Onset  . Hypertension Mother   . Arthritis Mother   . Liver disease Father   . Alcoholism Father   . Diabetes Maternal Grandmother   . Colon cancer Maternal Grandfather      HOME MEDICATIONS: Allergies as of 10/12/2019      Reactions   Metformin And Related Diarrhea   "extreme" diarrhea   Simvastatin Other (See Comments)   Leg cramps   Penicillins    Augmentin [amoxicillin-pot Clavulanate] Rash   Cyclobenzaprine Palpitations   Body aches,nausea,weakness   Sulfa Antibiotics Rash      Medication List       Accurate as of October 12, 2019 10:39 AM. If you have any questions, ask your nurse or doctor.        aspirin EC 81 MG tablet Take 81 mg by mouth daily.   atorvastatin 40 MG tablet Commonly known as: LIPITOR Take 1 tablet (40 mg total) by mouth daily.   bisoprolol-hydrochlorothiazide 2.5-6.25 MG tablet Commonly known as: ZIAC Take 1 tablet by mouth daily.   Contour Next One  Kit by Does not apply route.   estradiol 0.1 MG/24HR patch Commonly known as: VIVELLE-DOT APP 1 PA EXT TO THE SKIN 2 TIMES A WK UTD   glipiZIDE 5 MG tablet Commonly known as: GLUCOTROL Take 1 tablet (5 mg total) by mouth 2 (two) times daily before a meal.   losartan 50 MG tablet Commonly known as: COZAAR Take 50 mg by mouth daily.   metFORMIN 500 MG tablet Commonly known as: GLUCOPHAGE Take by mouth 3 (three) times daily.        OBJECTIVE:   Vital Signs: BP (!) 146/82 (BP Location: Left Arm, Patient Position: Sitting, Cuff Size: Large)   Pulse 81   Temp 98.3 F (36.8 C)   Ht '5\' 6"'  (1.676 m)    Wt 184 lb 9.6 oz (83.7 kg)   SpO2 97%   BMI 29.80 kg/m   Wt Readings from Last 3 Encounters:  10/12/19 184 lb 9.6 oz (83.7 kg)  06/08/19 184 lb 12.8 oz (83.8 kg)  04/13/19 188 lb (85.3 kg)     Exam: General: Pt appears well and is in NAD  Lungs: Clear with good BS bilat with no rales, rhonchi, or wheezes  Heart: RRR with normal S1 and S2 and no gallops; no murmurs; no rub  Abdomen: Normoactive bowel sounds, soft, nontender, without masses or organomegaly palpable  Extremities: No pretibial edema.  Skin: Normal texture and temperature to palpation. No rash noted. No Acanthosis nigricans/skin tags. No lipohypertrophy.  Neuro: MS is good with appropriate affect, pt is alert and Ox3       DM foot exam:10/12/2019  The skin of the feet is intact without sores or ulcerations. The pedal pulses are 2+ on right and 2+ on left. The sensation is intact to a screening 5.07, 10 gram monofilament bilaterally    DATA REVIEWED:  Lab Results  Component Value Date   HGBA1C 7.2 (A) 10/12/2019   HGBA1C 6.8 (A) 06/08/2019   HGBA1C 7.3 (A) 03/16/2019      12/09/18   A1c 8.2% BUN/Cr 9/0.620 TG 1478 mg  T. Chol 270 HDL 35    ASSESSMENT / PLAN / RECOMMENDATIONS:   1) Type 2 Diabetes Mellitus, Optimally controlled, Without complications - Most recent A1c of 7.2 %. Goal A1c < 7.0 %.     Minimal increase in A1c, she attributes this to dietary indiscretions due to being busy at work.  Pt is not a candidate for DPP-4 inhibitors nor GLP-1 agonists due to increased risk of pancreatitis given her high TG.   No changes will be made today   MEDICATIONS: - Continue Glipizide 5 mg, 1 tablet before breakfast and Supper - Continue Metformin 1000 mg twice daily with meals   EDUCATION / INSTRUCTIONS:  BG monitoring instructions: Patient is instructed to check her blood sugars 2 times a day, fasting and supper.  Call [place] [facility] if: BG persistently < 70 or > 300. . I  reviewed the Rule of 15 for the treatment of hypoglycemia in detail with the patient. Literature supplied.   2) Hypertriglyceridemia:  -The last triglyceride level was in June 2020 at 1478 mg/dl , I offered to check her triglyceride levels today, but the patient stated that she is going to her PCP next week and labs will be drawn then. -She has been compliant with atorvastatin 40 mg daily -We discussed the increased risk of pancreatitis in the setting of a triglyceride level over 1000 mg/dL      F/U in 6  months    Signed electronically by: Mack Guise, MD  [facility] Endocrin[facility]  [facility] [address] [place], [place] [address] Phone: [phone] FAX: [phone]   CC: Sharilyn Sites, [place] [address] Phone: [phone]  Fax: [phone]  Return to Endocrinology clinic as below: No future appointments.

## 2020-01-04 ENCOUNTER — Ambulatory Visit: Payer: PRIVATE HEALTH INSURANCE | Admitting: Family Medicine

## 2020-04-08 ENCOUNTER — Other Ambulatory Visit: Payer: Self-pay | Admitting: Internal Medicine

## 2020-04-11 ENCOUNTER — Ambulatory Visit (INDEPENDENT_AMBULATORY_CARE_PROVIDER_SITE_OTHER): Payer: PRIVATE HEALTH INSURANCE | Admitting: Internal Medicine

## 2020-04-11 ENCOUNTER — Other Ambulatory Visit: Payer: Self-pay

## 2020-04-11 ENCOUNTER — Encounter: Payer: Self-pay | Admitting: Internal Medicine

## 2020-04-11 VITALS — BP 148/86 | HR 90 | Ht 66.0 in | Wt 183.0 lb

## 2020-04-11 DIAGNOSIS — E781 Pure hyperglyceridemia: Secondary | ICD-10-CM

## 2020-04-11 DIAGNOSIS — E1165 Type 2 diabetes mellitus with hyperglycemia: Secondary | ICD-10-CM

## 2020-04-11 LAB — LIPID PANEL
Cholesterol: 137 mg/dL (ref 0–200)
HDL: 37.3 mg/dL — ABNORMAL LOW (ref >39.00)
Total CHOL/HDL Ratio: 4
Triglycerides: 432 mg/dL — ABNORMAL HIGH (ref 0.0–149.0)

## 2020-04-11 LAB — BASIC METABOLIC PANEL
BUN: 9 mg/dL (ref 6–23)
CO2: 29 mEq/L (ref 19–32)
Calcium: 9.3 mg/dL (ref 8.4–10.5)
Chloride: 102 mEq/L (ref 96–112)
Creatinine, Ser: 0.61 mg/dL (ref 0.40–1.20)
GFR: 95.84 mL/min (ref >60.00)
Glucose, Bld: 114 mg/dL — ABNORMAL HIGH (ref 70–99)
Potassium: 3.7 mEq/L (ref 3.5–5.1)
Sodium: 138 mEq/L (ref 135–145)

## 2020-04-11 LAB — LDL CHOLESTEROL, DIRECT: Direct LDL: 50 mg/dL

## 2020-04-11 LAB — POCT GLYCOSYLATED HEMOGLOBIN (HGB A1C): Hemoglobin A1C: 7.4 % — AB (ref 4.0–5.6)

## 2020-04-11 NOTE — Patient Instructions (Signed)
-   Increase Glipizide 5 mg, 1 tablet before breakfast and 1.5 tabs before Supper - Continue Metformin 1000 mg twice daily with meals        HOW TO TREAT LOW BLOOD SUGARS (Blood sugar LESS THAN 70 MG/DL)  Please follow the RULE OF 15 for the treatment of hypoglycemia treatment (when your (blood sugars are less than 70 mg/dL)    STEP 1: Take 15 grams of carbohydrates when your blood sugar is low, which includes:   3-4 GLUCOSE TABS  OR  3-4 OZ OF JUICE OR REGULAR SODA OR  ONE TUBE OF GLUCOSE GEL     STEP 2: RECHECK blood sugar in 15 MINUTES STEP 3: If your blood sugar is still low at the 15 minute recheck --> then, go back to STEP 1 and treat AGAIN with another 15 grams of carbohydrates.

## 2020-04-11 NOTE — Progress Notes (Signed)
Name: Marie Bell  Age/ Sex: 64 y.o., female   MRN/ DOB: [id number], 1955-07-17     PCP: Sharilyn Sites, MD   Reason for Endocrinology Evaluation: Type 2 Diabetes Mellitus  Initial Endocrine Consultative Visit: 03/16/2019    PATIENT IDENTIFIER: [name] is a 64 y.o. female with a past medical history of HTN, T2Dm and Dyslipidemia . The patient has followed with Endocrinology clinic since 03/16/2019 for consultative assistance with management of her diabetes.  DIABETIC HISTORY:  Ms. Chiem was diagnosed with DM ~ 2012. Has been prescribed  Janumet, januvia and Linagliptin but unable to use due to high cost.  Her hemoglobin A1c has ranged from 6.0% , peaking at 9.0%.    On her initial visit to our clinic she had an A1c of 7.3% on Metformin and Glimepiride. We switched Glimepiride with glipizide and continued Metformin   SUBJECTIVE:   During the last visit (10/12/2019): A1c of 7.2 % .We continued glipizide and Metformin   Today (04/11/2020): Ms. Rasco is here for a 6 month follow up On diabetes.  She checks her blood sugars 1 times daily, preprandial to breakfast. The patient has not had hypoglycemic episodes since the last clinic visit.   Has been having issues with congestion and cough as well as UTI  Has noted right foot pain and swelling but this has resolved with Aleve    HOME DIABETES REGIMEN:  Glipizide 5 mg BID Metformin 1000 mg BID    METER DOWNLOAD SUMMARY: 10/11-10/25/2021 Average Number Tests/Day = 0.8 Overall Mean FS Glucose = 164  BG Ranges: Low = 156 High = 199   Hypoglycemic Events/30 Days: BG < 50 = 0 Episodes of symptomatic severe hypoglycemia = 0     HISTORY:  Past Medical History:  Past Medical History:  Diagnosis Date  . DM (diabetes mellitus) ([place])   . Hyperlipidemia   . Hypertension    Past Surgical History:  Past Surgical History:  Procedure Laterality Date  . CHOLECYSTECTOMY    . TOTAL VAGINAL HYSTERECTOMY      Social  History:  reports that she quit smoking about 13 years ago. Her smoking use included e-cigarettes. She has never used smokeless tobacco. She reports that she does not drink alcohol and does not use drugs. Family History:  Family History  Problem Relation Age of Onset  . Hypertension Mother   . Arthritis Mother   . Liver disease Father   . Alcoholism Father   . Diabetes Maternal Grandmother   . Colon cancer Maternal Grandfather      HOME MEDICATIONS: Allergies as of 04/11/2020      Reactions   Simvastatin Other (See Comments)   Leg cramps   Penicillins    Augmentin [amoxicillin-pot Clavulanate] Rash   Cyclobenzaprine Palpitations   Body aches,nausea,weakness   Sulfa Antibiotics Rash      Medication List       Accurate as of April 11, 2020 10:44 AM. If you have any questions, ask your nurse or doctor.        aspirin EC 81 MG tablet Take 81 mg by mouth daily.   atorvastatin 40 MG tablet Commonly known as: LIPITOR Take 1 tablet (40 mg total) by mouth daily.   bisoprolol-hydrochlorothiazide 2.5-6.25 MG tablet Commonly known as: ZIAC Take 1 tablet by mouth daily.   Contour Next One Kit by Does not apply route.   estradiol 0.1 MG/24HR patch Commonly known as: VIVELLE-DOT APP 1 PA EXT TO THE SKIN 2  TIMES A WK UTD   glipiZIDE 5 MG tablet Commonly known as: GLUCOTROL Take 1 tablet (5 mg total) by mouth 2 (two) times daily before a meal.   losartan 50 MG tablet Commonly known as: COZAAR Take 50 mg by mouth daily.   metFORMIN 1000 MG tablet Commonly known as: GLUCOPHAGE Take 1 tablet (1,000 mg total) by mouth 2 (two) times daily with a meal.        OBJECTIVE:   Vital Signs: BP (!) 148/86   Pulse 90   Ht '5\' 6"'  (1.676 m)   Wt 183 lb (83 kg)   SpO2 97%   BMI 29.54 kg/m   Wt Readings from Last 3 Encounters:  04/11/20 183 lb (83 kg)  10/12/19 184 lb 9.6 oz (83.7 kg)  06/08/19 184 lb 12.8 oz (83.8 kg)     Exam: General: Pt appears well and is in NAD   Lungs: Clear with good BS bilat with no rales, rhonchi, or wheezes  Heart: RRR with normal S1 and S2 and no gallops; no murmurs; no rub  Abdomen: Normoactive bowel sounds, soft, nontender, without masses or organomegaly palpable  Extremities: No pretibial edema.  Skin: Normal texture and temperature to palpation. No rash noted. No Acanthosis nigricans/skin tags. No lipohypertrophy.  Neuro: MS is good with appropriate affect, pt is alert and Ox3       DM foot exam:04/11/2020  The skin of the feet is intact without sores or ulcerations. The pedal pulses are 2+ on right and 2+ on left. The sensation is intact to a screening 5.07, 10 gram monofilament bilaterally    DATA REVIEWED:  Lab Results  Component Value Date   HGBA1C 7.2 (A) 10/12/2019   HGBA1C 6.8 (A) 06/08/2019   HGBA1C 7.3 (A) 03/16/2019      12/09/18   A1c 8.2% BUN/Cr 9/0.620 TG 1478 mg  T. Chol 270 HDL 35    ASSESSMENT / PLAN / RECOMMENDATIONS:   1) Type 2 Diabetes Mellitus, Optimally controlled, Without complications - Most recent A1c of 7.4 %. Goal A1c < 7.0 %.     Minimal increase in A1c, she attributes this to dietary indiscretions due to being busy at work.  Pt is not a candidate for DPP-4 inhibitors nor GLP-1 agonists due to increased risk of pancreatitis given her high TG.   Will make the following adjustments   MEDICATIONS: - Increase Glipizide 5 mg, 1 tablet before breakfast and 1.5 tablets before Supper - Continue Metformin 1000 mg twice daily with meals   EDUCATION / INSTRUCTIONS:  BG monitoring instructions: Patient is instructed to check her blood sugars 2 times a day, fasting and supper.  Call [place] [facility] clinic if: BG persistently < 70 . I reviewed the Rule of 15 for the treatment of hypoglycemia in detail with the patient. Literature supplied.   2) Hypertriglyceridemia:  -The last triglyceride level was in June 2020 at 1478 mg/dl , and 977 in 10/20/2019 -She has  been compliant with atorvastatin 40 mg daily for the past 60 days. - Repeat Tg is trending down at 432 mg/dL , pt opted to continue with Atorvastatin at this time and hold off on Vascepa or fish oil  -We discussed the increased risk of pancreatitis in the setting of a triglyceride level over 1000 mg/dL      F/U in 6 months   Addendum: Discussed results with pt on 04/12/2020 at 1300   Signed electronically by: Mack Guise, MD  [facility] End[facility]in[facility]  [facility] [address], [place], [place] [address] Phone: [phone] FAX: [phone]   CC: Sharilyn Sites, [place] [address] Phone: [phone]  Fax: [phone]  Return to Endocrinology clinic as below: No future appointments.

## 2020-04-12 MED ORDER — ATORVASTATIN CALCIUM 40 MG PO TABS: 40.0000 mg | ORAL_TABLET | Freq: Every day | ORAL | 3 refills | Status: DC

## 2020-10-10 ENCOUNTER — Ambulatory Visit: Payer: PRIVATE HEALTH INSURANCE | Admitting: Internal Medicine

## 2020-10-19 ENCOUNTER — Other Ambulatory Visit: Payer: Self-pay | Admitting: Internal Medicine

## 2020-10-29 ENCOUNTER — Other Ambulatory Visit: Payer: Self-pay | Admitting: Internal Medicine

## 2020-10-31 NOTE — Telephone Encounter (Signed)
Refill sent.

## 2020-10-31 NOTE — Telephone Encounter (Signed)
Patient called to check status of refill for Glipizide - she is out

## 2020-11-03 ENCOUNTER — Encounter: Payer: Self-pay | Admitting: Internal Medicine

## 2020-11-03 ENCOUNTER — Other Ambulatory Visit: Payer: Self-pay

## 2020-11-03 ENCOUNTER — Ambulatory Visit (INDEPENDENT_AMBULATORY_CARE_PROVIDER_SITE_OTHER): Payer: PRIVATE HEALTH INSURANCE | Admitting: Internal Medicine

## 2020-11-03 VITALS — BP 134/84 | HR 78 | Ht 66.0 in | Wt 183.2 lb

## 2020-11-03 DIAGNOSIS — E781 Pure hyperglyceridemia: Secondary | ICD-10-CM | POA: Diagnosis not present

## 2020-11-03 DIAGNOSIS — E1165 Type 2 diabetes mellitus with hyperglycemia: Secondary | ICD-10-CM | POA: Diagnosis not present

## 2020-11-03 LAB — POCT GLYCOSYLATED HEMOGLOBIN (HGB A1C): Hemoglobin A1C: 7.1 % — AB (ref 4.0–5.6)

## 2020-11-03 LAB — POCT GLUCOSE (DEVICE FOR HOME USE): POC Glucose: 200 mg/dl — AB (ref 70–99)

## 2020-11-03 MED ORDER — GLIPIZIDE 5 MG PO TABS: ORAL_TABLET | ORAL | 3 refills | Status: DC

## 2020-11-03 NOTE — Progress Notes (Signed)
Name: Marie Bell  Age/ Sex: 65 y.o., female   MRN/ DOB: [id number], 18-Dec-1955     PCP: Sharilyn Sites, MD   Reason for Endocrinology Evaluation: Type 2 Diabetes Mellitus  Initial Endocrine Consultative Visit: 03/16/2019    PATIENT IDENTIFIER: Marie Bell is a 65 y.o. female with a past medical history of HTN, T2Dm and Dyslipidemia . The patient has followed with Endocrinology clinic since 03/16/2019 for consultative assistance with management of her diabetes.  DIABETIC HISTORY:  Marie Bell was diagnosed with DM ~ 2012. Has been prescribed  Janumet, januvia and Linagliptin but unable to use due to high cost.  Her hemoglobin A1c has ranged from 6.0% , peaking at 9.0%.    On her initial visit to our clinic she had an A1c of 7.3% on Metformin and Glimepiride. We switched Glimepiride with glipizide and continued Metformin   SUBJECTIVE:   During the last visit (04/11/2020): A1c of 7.4% .We increased glipizide and continued Metformin   Today (11/03/2020): Marie Bell is here for a 6 month follow up On diabetes.  She checks her blood sugars 1 times daily, preprandial to breakfast. The patient has not had hypoglycemic episodes since the last clinic visit.   Has thumb pains  which is chronic.  Lipitor was stopped by PCP per pt ? Has been off 6 months. Due to pains and UTI's     HOME DIABETES REGIMEN:  Glipizide 5 mg , 1 tablet before Breakfast and 1.5 tabs before supper  Metformin 1000 mg BID      METER DOWNLOAD SUMMARY: Did not bring    DIABETIC COMPLICATIONS: Microvascular complications:   Right eye retinopathy,left eye retinal detachment   Denies: neuropathy, CKD    Last eye exam: Completed  2021   Macrovascular complications:    Denies: CAD, PVD, CVA    HISTORY:  Past Medical History:  Past Medical History:  Diagnosis Date  . DM (diabetes mellitus) ([place])   . Hyperlipidemia   . Hypertension    Past Surgical History:  Past Surgical History:   Procedure Laterality Date  . CHOLECYSTECTOMY    . TOTAL VAGINAL HYSTERECTOMY      Social History:  reports that she quit smoking about 14 years ago. Her smoking use included e-cigarettes. She has never used smokeless tobacco. She reports that she does not drink alcohol and does not use drugs. Family History:  Family History  Problem Relation Age of Onset  . Hypertension Mother   . Arthritis Mother   . Liver disease Father   . Alcoholism Father   . Diabetes Maternal Grandmother   . Colon cancer Maternal Grandfather      HOME MEDICATIONS: Allergies as of 11/03/2020      Reactions   Simvastatin Other (See Comments)   Leg cramps   Penicillins    Augmentin [amoxicillin-pot Clavulanate] Rash   Cyclobenzaprine Palpitations   Body aches,nausea,weakness   Sulfa Antibiotics Rash      Medication List       Accurate as of Nov 03, 2020 10:24 AM. If you have any questions, ask your nurse or doctor.        STOP taking these medications   atorvastatin 40 MG tablet Commonly known as: LIPITOR Stopped by: Dorita Sciara, MD     TAKE these medications   aspirin EC 81 MG tablet Take 81 mg by mouth daily.   bisoprolol-hydrochlorothiazide 2.5-6.25 MG tablet Commonly known as: ZIAC Take 1 tablet by mouth daily.  Contour Next One Kit by Does not apply route.   estradiol 0.1 MG/24HR patch Commonly known as: VIVELLE-DOT APP 1 PA EXT TO THE SKIN 2 TIMES A WK UTD   glipiZIDE 5 MG tablet Commonly known as: GLUCOTROL Taking 1 tablet before breakfast and 1.5 tabs before Supper   losartan 50 MG tablet Commonly known as: COZAAR Take 50 mg by mouth daily.   metFORMIN 1000 MG tablet Commonly known as: GLUCOPHAGE TAKE 1 TABLET(1000 MG) BY MOUTH TWICE DAILY WITH A MEAL        OBJECTIVE:   Vital Signs: BP 134/84   Pulse 78   Ht '5\' 6"'  (1.676 m)   Wt 183 lb 4 oz (83.1 kg)   SpO2 98%   BMI 29.58 kg/m   Wt Readings from Last 3 Encounters:  11/03/20 183 lb 4 oz (83.1  kg)  04/11/20 183 lb (83 kg)  10/12/19 184 lb 9.6 oz (83.7 kg)     Exam: General: Pt appears well and is in NAD  Lungs: Clear with good BS bilat with no rales, rhonchi, or wheezes  Heart: RRR with normal S1 and S2 and no gallops; no murmurs; no rub  Abdomen: Normoactive bowel sounds, soft, nontender, without masses or organomegaly palpable  Extremities: No pretibial edema.  Skin: Normal texture and temperature to palpation. No rash noted. No Acanthosis nigricans/skin tags. No lipohypertrophy.  Neuro: MS is good with appropriate affect, pt is alert and Ox3       DM foot exam:04/11/2020  The skin of the feet is intact without sores or ulcerations. The pedal pulses are 2+ on right and 2+ on left. The sensation is intact to a screening 5.07, 10 gram monofilament bilaterally    DATA REVIEWED:  Lab Results  Component Value Date   HGBA1C 7.1 (A) 11/03/2020   HGBA1C 7.4 (A) 04/11/2020   HGBA1C 7.2 (A) 10/12/2019   Results for AERIS, HERSMAN (MRN [id number]) as of 11/04/2020 11:30  Ref. Range 04/11/2020 11:31  Sodium Latest Ref Range: 135 - 145 mEq/L 138  Potassium Latest Ref Range: 3.5 - 5.1 mEq/L 3.7  Chloride Latest Ref Range: 96 - 112 mEq/L 102  CO2 Latest Ref Range: 19 - 32 mEq/L 29  Glucose Latest Ref Range: 70 - 99 mg/dL 114 (H)  BUN Latest Ref Range: 6 - 23 mg/dL 9  Creatinine Latest Ref Range: 0.40 - 1.20 mg/dL 0.61  Calcium Latest Ref Range: 8.4 - 10.5 mg/dL 9.3  GFR Latest Ref Range: >60.00 mL/min 95.84  Total CHOL/HDL Ratio Unknown 4  Cholesterol Latest Ref Range: 0 - 200 mg/dL 137  HDL Cholesterol Latest Ref Range: >39.00 mg/dL 37.30 (L)  Direct LDL Latest Units: mg/dL 50.0  Triglycerides Latest Ref Range: 0.0 - 149.0 mg/dL 432.0 Triglyceride is over 400; calculations on Lipids are invalid. (H)    ASSESSMENT / PLAN / RECOMMENDATIONS:   1) Type 2 Diabetes Mellitus, Optimally controlled, Without complications - Most recent A1c of 7.1%. Goal A1c < 7.0 %.      Her A1c is trending down  She is trying to cut down on carbohydrate intake but there is still room for improvement  Pt is not a candidate for DPP-4 inhibitors nor GLP-1 agonists due to increased risk of pancreatitis given her high TG.   Will make the following adjustments   MEDICATIONS: - Increase Glipizide 5 mg, 1 tablet before breakfast and 2 tablets before Supper - Continue Metformin 1000 mg twice daily with meals   EDUCATION /  INSTRUCTIONS:  BG monitoring instructions: Patient is instructed to check her blood sugars 2 times a day, fasting and supper.  Call [place] [facility] if: BG persistently < 70 . I reviewed the Rule of 15 for the treatment of hypoglycemia in detail with the patient. Literature supplied.   2) Hypertriglyceridemia:  -Patient has history of severely elevated triglyceride levels up to 1478 mg/DL and June 2020.  By 03/2020 this has trended down to 432 MG/DL. -Today she tells me that she has not had atorvastatin since October 2021.  She had under the impression that her PCP stopped it because she was having UTIs, I explained to the patient that statins are not associated with UTIs. -She continues to downplay the elevation of her triglycerides, I again discussed her high risk for developing pancreatitis which could be fatal.     F/U in 6 months    Signed electronically by: Mack Guise, MD  [facility] Endocrin[facility]  [facility] [address] [place], [place] [address] Phone: [phone] FAX: [phone]   CC: Sharilyn Sites, [place] [address] Phone: [phone]  Fax: [phone]  Return to Endocrinology clinic as below: No future appointments.

## 2020-11-03 NOTE — Patient Instructions (Signed)
-   Increase Glipizide 5 mg, 1 tablet before breakfast and 2 tabs before Supper - Continue Metformin 1000 mg twice daily with meals        HOW TO TREAT LOW BLOOD SUGARS (Blood sugar LESS THAN 70 MG/DL)  Please follow the RULE OF 15 for the treatment of hypoglycemia treatment (when your (blood sugars are less than 70 mg/dL)    STEP 1: Take 15 grams of carbohydrates when your blood sugar is low, which includes:   3-4 GLUCOSE TABS  OR  3-4 OZ OF JUICE OR REGULAR SODA OR  ONE TUBE OF GLUCOSE GEL     STEP 2: RECHECK blood sugar in 15 MINUTES STEP 3: If your blood sugar is still low at the 15 minute recheck --> then, go back to STEP 1 and treat AGAIN with another 15 grams of carbohydrates.

## 2020-11-07 ENCOUNTER — Observation Stay (HOSPITAL_COMMUNITY)
Admission: EM | Admit: 2020-11-07 | Discharge: 2020-11-08 | Disposition: A | Discharge status: Home / Self Care | Payer: PRIVATE HEALTH INSURANCE | Attending: Family Medicine | Admitting: Family Medicine

## 2020-11-07 ENCOUNTER — Encounter (HOSPITAL_COMMUNITY): Payer: Self-pay

## 2020-11-07 ENCOUNTER — Emergency Department (HOSPITAL_COMMUNITY): Payer: PRIVATE HEALTH INSURANCE

## 2020-11-07 ENCOUNTER — Other Ambulatory Visit: Payer: Self-pay

## 2020-11-07 DIAGNOSIS — Z7984 Long term (current) use of oral hypoglycemic drugs: Secondary | ICD-10-CM | POA: Insufficient documentation

## 2020-11-07 DIAGNOSIS — Z79899 Other long term (current) drug therapy: Secondary | ICD-10-CM | POA: Diagnosis not present

## 2020-11-07 DIAGNOSIS — E782 Mixed hyperlipidemia: Secondary | ICD-10-CM | POA: Diagnosis present

## 2020-11-07 DIAGNOSIS — Z20822 Contact with and (suspected) exposure to covid-19: Secondary | ICD-10-CM | POA: Diagnosis not present

## 2020-11-07 DIAGNOSIS — I10 Essential (primary) hypertension: Secondary | ICD-10-CM | POA: Diagnosis not present

## 2020-11-07 DIAGNOSIS — Y900 Blood alcohol level of less than 20 mg/100 ml: Secondary | ICD-10-CM | POA: Insufficient documentation

## 2020-11-07 DIAGNOSIS — I639 Cerebral infarction, unspecified: Secondary | ICD-10-CM | POA: Diagnosis not present

## 2020-11-07 DIAGNOSIS — Z720 Tobacco use: Secondary | ICD-10-CM | POA: Diagnosis present

## 2020-11-07 DIAGNOSIS — E119 Type 2 diabetes mellitus without complications: Secondary | ICD-10-CM | POA: Diagnosis not present

## 2020-11-07 DIAGNOSIS — Z87891 Personal history of nicotine dependence: Secondary | ICD-10-CM | POA: Diagnosis not present

## 2020-11-07 DIAGNOSIS — R200 Anesthesia of skin: Secondary | ICD-10-CM | POA: Diagnosis present

## 2020-11-07 DIAGNOSIS — E781 Pure hyperglyceridemia: Secondary | ICD-10-CM | POA: Diagnosis not present

## 2020-11-07 DIAGNOSIS — E1165 Type 2 diabetes mellitus with hyperglycemia: Secondary | ICD-10-CM

## 2020-11-07 DIAGNOSIS — E1169 Type 2 diabetes mellitus with other specified complication: Secondary | ICD-10-CM | POA: Diagnosis present

## 2020-11-07 LAB — COMPREHENSIVE METABOLIC PANEL
ALT: 19 U/L (ref 0–44)
AST: 13 U/L — ABNORMAL LOW (ref 15–41)
Albumin: 4 g/dL (ref 3.5–5.0)
Alkaline Phosphatase: 54 U/L (ref 38–126)
Anion gap: 10 (ref 5–15)
BUN: 8 mg/dL (ref 8–23)
CO2: 26 mmol/L (ref 22–32)
Calcium: 9.2 mg/dL (ref 8.9–10.3)
Chloride: 99 mmol/L (ref 98–111)
Creatinine, Ser: 0.6 mg/dL (ref 0.44–1.00)
GFR, Estimated: >60 mL/min (ref >60)
Glucose, Bld: 139 mg/dL — ABNORMAL HIGH (ref 70–99)
Potassium: 3.7 mmol/L (ref 3.5–5.1)
Sodium: 135 mmol/L (ref 135–145)
Total Bilirubin: 0.4 mg/dL (ref 0.3–1.2)
Total Protein: 6.8 g/dL (ref 6.5–8.1)

## 2020-11-07 LAB — CBC
HCT: 41.9 % (ref 36.0–46.0)
Hemoglobin: 13.8 g/dL (ref 12.0–15.0)
MCH: 29.8 pg (ref 26.0–34.0)
MCHC: 32.9 g/dL (ref 30.0–36.0)
MCV: 90.5 fL (ref 80.0–100.0)
Platelets: 246 10*3/uL (ref 150–400)
RBC: 4.63 MIL/uL (ref 3.87–5.11)
RDW: 13.3 % (ref 11.5–15.5)
WBC: 8.1 10*3/uL (ref 4.0–10.5)
nRBC: 0 % (ref 0.0–0.2)

## 2020-11-07 LAB — RAPID URINE DRUG SCREEN, HOSP PERFORMED
Amphetamines: NOT DETECTED
Barbiturates: NOT DETECTED
Benzodiazepines: NOT DETECTED
Cocaine: NOT DETECTED
Opiates: NOT DETECTED
Tetrahydrocannabinol: NOT DETECTED

## 2020-11-07 LAB — URINALYSIS, ROUTINE W REFLEX MICROSCOPIC
Bilirubin Urine: NEGATIVE
Glucose, UA: NEGATIVE mg/dL
Hgb urine dipstick: NEGATIVE
Ketones, ur: NEGATIVE mg/dL
Leukocytes,Ua: NEGATIVE
Nitrite: NEGATIVE
Protein, ur: NEGATIVE mg/dL
Specific Gravity, Urine: 1.008 (ref 1.005–1.030)
pH: 7 (ref 5.0–8.0)

## 2020-11-07 LAB — RESP PANEL BY RT-PCR (FLU A&B, COVID) ARPGX2
Influenza A by PCR: NEGATIVE
Influenza B by PCR: NEGATIVE
SARS Coronavirus 2 by RT PCR: NEGATIVE

## 2020-11-07 LAB — DIFFERENTIAL
Abs Immature Granulocytes: 0.01 10*3/uL (ref 0.00–0.07)
Basophils Absolute: 0.1 10*3/uL (ref 0.0–0.1)
Basophils Relative: 1 %
Eosinophils Absolute: 0.5 10*3/uL (ref 0.0–0.5)
Eosinophils Relative: 6 %
Immature Granulocytes: 0 %
Lymphocytes Relative: 27 %
Lymphs Abs: 2.2 10*3/uL (ref 0.7–4.0)
Monocytes Absolute: 0.6 10*3/uL (ref 0.1–1.0)
Monocytes Relative: 7 %
Neutro Abs: 4.9 10*3/uL (ref 1.7–7.7)
Neutrophils Relative %: 59 %

## 2020-11-07 LAB — PROTIME-INR
INR: 1 (ref 0.8–1.2)
Prothrombin Time: 12.8 seconds (ref 11.4–15.2)

## 2020-11-07 LAB — GLUCOSE, CAPILLARY
Glucose-Capillary: 228 mg/dL — ABNORMAL HIGH (ref 70–99)
Glucose-Capillary: 113 mg/dL — ABNORMAL HIGH (ref 70–99)

## 2020-11-07 LAB — CBG MONITORING, ED: Glucose-Capillary: 168 mg/dL — ABNORMAL HIGH (ref 70–99)

## 2020-11-07 LAB — ETHANOL: Alcohol, Ethyl (B): <10 mg/dL (ref <10)

## 2020-11-07 LAB — APTT: aPTT: 27 seconds (ref 24–36)

## 2020-11-07 MED ORDER — ENOXAPARIN SODIUM 40 MG/0.4ML IJ SOSY
40.0000 mg | PREFILLED_SYRINGE | INTRAMUSCULAR | Status: DC
  Administered 2020-11-07: 40 mg via SUBCUTANEOUS
  Filled 2020-11-07: qty 0.4

## 2020-11-07 MED ORDER — ASPIRIN 325 MG PO TABS
325.0000 mg | ORAL_TABLET | Freq: Every day | ORAL | Status: DC
  Administered 2020-11-07 – 2020-11-08 (×2): 325 mg via ORAL
  Filled 2020-11-07 (×2): qty 1

## 2020-11-07 MED ORDER — GLIPIZIDE 5 MG PO TABS
5.0000 mg | ORAL_TABLET | Freq: Two times a day (BID) | ORAL | Status: DC
  Administered 2020-11-08: 5 mg via ORAL
  Filled 2020-11-07: qty 1

## 2020-11-07 MED ORDER — ASPIRIN 300 MG RE SUPP: 300.0000 mg | Freq: Every day | RECTAL | Status: DC

## 2020-11-07 MED ORDER — INSULIN ASPART 100 UNIT/ML IJ SOLN: 0.0000 [IU] | Freq: Three times a day (TID) | INTRAMUSCULAR | Status: DC

## 2020-11-07 MED ORDER — LOSARTAN POTASSIUM 50 MG PO TABS: 50.0000 mg | ORAL_TABLET | Freq: Every day | ORAL | Status: DC

## 2020-11-07 MED ORDER — INSULIN ASPART 100 UNIT/ML IJ SOLN: 0.0000 [IU] | Freq: Every day | INTRAMUSCULAR | Status: DC

## 2020-11-07 MED ORDER — ACETAMINOPHEN 325 MG PO TABS: 650.0000 mg | ORAL_TABLET | ORAL | Status: DC | PRN

## 2020-11-07 MED ORDER — BISOPROLOL-HYDROCHLOROTHIAZIDE 2.5-6.25 MG PO TABS
1.0000 | ORAL_TABLET | Freq: Every day | ORAL | Status: DC
  Filled 2020-11-07 (×2): qty 1

## 2020-11-07 MED ORDER — ACETAMINOPHEN 650 MG RE SUPP: 650.0000 mg | RECTAL | Status: DC | PRN

## 2020-11-07 MED ORDER — STROKE: EARLY STAGES OF RECOVERY BOOK: Freq: Once | Status: AC

## 2020-11-07 MED ORDER — METFORMIN HCL 500 MG PO TABS
1000.0000 mg | ORAL_TABLET | Freq: Two times a day (BID) | ORAL | Status: DC
  Administered 2020-11-07 – 2020-11-08 (×2): 1000 mg via ORAL
  Filled 2020-11-07 (×2): qty 2

## 2020-11-07 MED ORDER — ACETAMINOPHEN 160 MG/5ML PO SOLN: 650.0000 mg | ORAL | Status: DC | PRN

## 2020-11-07 NOTE — ED Notes (Signed)
Pt in CT at this time.

## 2020-11-07 NOTE — H&P (Signed)
History and Physical  Marie Bell:[id number] DOB: 08-11-1955 DOA: 11/07/2020  Referring physician: Dr Deretha Emory, ED physician PCP: Assunta Found, MD  Outpatient Specialists:   Patient Coming From: Home  Chief Complaint: Numbness in right hand  HPI: Marie Bell is a 65 y.o. female with a history of diabetes, hypertension, hyperlipidemia.  Patient is an occasional smoker.  Patient woke up this morning around 645 to numbness in her right index finger and "something wrong with the right side of her face".  She had difficulty walking first thing in this morning with weakness in her right leg.  As time went on, her leg weakness improved, but she continues to have numbness in the right finger.  There were no palliating or provoking factors.  Emergency Department Course: Normal white matter disease possibly due to small vessel ischemia.  MRI done showing left thalamic capsular infarct with severe stenosis of distal right A1 ACA and moderate stenosis of left internal carotid artery  Review of Systems:   Pt denies any fevers, chills, nausea, vomiting, diarrhea, constipation, abdominal pain, shortness of breath, dyspnea on exertion, orthopnea, cough, wheezing, palpitations, headache, vision changes, lightheadedness, dizziness, melena, rectal bleeding.  Review of systems are otherwise negative  Past Medical History:  Diagnosis Date  . DM (diabetes mellitus) (HCC)   . Hyperlipidemia   . Hypertension    Past Surgical History:  Procedure Laterality Date  . CHOLECYSTECTOMY    . TOTAL VAGINAL HYSTERECTOMY     Social History:  reports that she quit smoking about 14 years ago. Her smoking use included e-cigarettes. She has never used smokeless tobacco. She reports that she does not drink alcohol and does not use drugs. Patient lives at home  Allergies  Allergen Reactions  . Simvastatin Other (See Comments)    Leg cramps  . Penicillins   . Augmentin [Amoxicillin-Pot Clavulanate] Rash   . Cyclobenzaprine Palpitations    Body aches,nausea,weakness  . Sulfa Antibiotics Rash    Family History  Problem Relation Age of Onset  . Hypertension Mother   . Arthritis Mother   . Liver disease Father   . Alcoholism Father   . Diabetes Maternal Grandmother   . Colon cancer Maternal Grandfather       Prior to Admission medications   Medication Sig Start Date End Date Taking? Authorizing Provider  bisoprolol-hydrochlorothiazide (ZIAC) 2.5-6.25 MG tablet Take 1 tablet by mouth daily.   Yes [provider]  glipiZIDE (GLUCOTROL) 5 MG tablet Take 1 tablet (5 mg total) by mouth daily before breakfast AND 2 tablets (10 mg total) daily before supper. Taking 1 tablet before breakfast and 1.5 tabs before Supper. Patient taking differently: Taking 1 tablet before breakfast and 1.5 tabs before Supper. 11/03/20  Yes Shamleffer, Konrad Dolores, MD  losartan (COZAAR) 50 MG tablet Take 50 mg by mouth daily.   Yes [provider]  metFORMIN (GLUCOPHAGE) 1000 MG tablet TAKE 1 TABLET(1000 MG) BY MOUTH TWICE DAILY WITH A MEAL 10/19/20  Yes Shamleffer, Konrad Dolores, MD    Physical Exam: BP (!) 163/73   Pulse 72   Temp 97.9 F (36.6 C) (Oral)   Resp 18   Ht  (1.676 m)   Wt 83 kg   SpO2 95%   BMI 29.54 kg/m   . General: Older female. Awake and alert and oriented x3. No acute cardiopulmonary distress.  Marland Kitchen HEENT: Normocephalic atraumatic.  Right and left ears normal in appearance.  Pupils equal, round, reactive to light. Extraocular muscles  are intact. Sclerae anicteric and noninjected.  Moist mucosal membranes. No mucosal lesions.  . Neck: Neck supple without lymphadenopathy. No carotid bruits. No masses palpated.  . Cardiovascular: Regular rate with normal S1-S2 sounds. No murmurs, rubs, gallops auscultated. No JVD.  Marland Kitchen Respiratory: Good respiratory effort with no wheezes, rales, rhonchi. Lungs clear to auscultation bilaterally.  No accessory muscle use. . Abdomen: Soft,  nontender, nondistended. Active bowel sounds. No masses or hepatosplenomegaly  . Skin: No rashes, lesions, or ulcerations.  Dry, warm to touch. 2+ dorsalis pedis and radial pulses. . Musculoskeletal: No calf or leg pain. All major joints not erythematous nontender.  No upper or lower joint deformation.  Good ROM.  No contractures  . Psychiatric: Intact judgment and insight. Pleasant and cooperative. . Neurologic: No focal neurological deficits.  Mildly diminished strength in her right hand and forearm.  Mild numbness in the right hand.  No other neurodeficits.  Cranial nerves II through XII are grossly intact.           Labs on Admission: I have personally reviewed following labs and imaging studies  CBC: Recent Labs  Lab 11/07/20 1016  WBC 8.1  NEUTROABS 4.9  HGB 13.8  HCT 41.9  MCV 90.5  PLT 246   Basic Metabolic Panel: Recent Labs  Lab 11/07/20 1016  NA 135  K 3.7  CL 99  CO2 26  GLUCOSE 139*  BUN 8  CREATININE 0.60  CALCIUM 9.2   GFR: Estimated Creatinine Clearance: 79.2 mL/min (by C-G formula based on SCr of 0.6 mg/dL). Liver Function Tests: Recent Labs  Lab 11/07/20 1016  AST 13*  ALT 19  ALKPHOS 54  BILITOT 0.4  PROT 6.8  ALBUMIN 4.0   No results for input(s): LIPASE, AMYLASE in the last 168 hours. No results for input(s): AMMONIA in the last 168 hours. Coagulation Profile: Recent Labs  Lab 11/07/20 1016  INR 1.0   Cardiac Enzymes: No results for input(s): CKTOTAL, CKMB, CKMBINDEX, TROPONINI in the last 168 hours. BNP (last 3 results) No results for input(s): PROBNP in the last 8760 hours. HbA1C: No results for input(s): HGBA1C in the last 72 hours. CBG: Recent Labs  Lab 11/07/20 0937  GLUCAP 168*   Lipid Profile: No results for input(s): CHOL, HDL, LDLCALC, TRIG, CHOLHDL, LDLDIRECT in the last 72 hours. Thyroid Function Tests: No results for input(s): TSH, T4TOTAL, FREET4, T3FREE, THYROIDAB in the last 72 hours. Anemia Panel: No results  for input(s): VITAMINB12, FOLATE, FERRITIN, TIBC, IRON, RETICCTPCT in the last 72 hours. Urine analysis:    Component Value Date/Time   COLORURINE STRAW (A) 11/07/2020 1100   APPEARANCEUR CLEAR 11/07/2020 1100   LABSPEC 1.008 11/07/2020 1100   PHURINE 7.0 11/07/2020 1100   GLUCOSEU NEGATIVE 11/07/2020 1100   HGBUR NEGATIVE 11/07/2020 1100   BILIRUBINUR NEGATIVE 11/07/2020 1100   KETONESUR NEGATIVE 11/07/2020 1100   PROTEINUR NEGATIVE 11/07/2020 1100   NITRITE NEGATIVE 11/07/2020 1100   LEUKOCYTESUR NEGATIVE 11/07/2020 1100   Sepsis Labs: @LABRCNTIP (procalcitonin:4,lacticidven:4) ) Recent Results (from the past 240 hour(s))  Resp Panel by RT-PCR (Flu A&B, Covid) Nasopharyngeal Swab     Status: None   Collection Time: 11/07/20  9:41 AM   Specimen: Nasopharyngeal Swab; Nasopharyngeal(NP) swabs in vial transport medium  Result Value Ref Range Status   SARS Coronavirus 2 by RT PCR NEGATIVE NEGATIVE Final    Comment: (NOTE) SARS-CoV-2 target nucleic acids are NOT DETECTED.  The SARS-CoV-2 RNA is generally detectable in upper respiratory specimens during the acute  phase of infection. The lowest concentration of SARS-CoV-2 viral copies this assay can detect is 138 copies/mL. A negative result does not preclude SARS-Cov-2 infection and should not be used as the sole basis for treatment or other patient management decisions. A negative result may occur with  improper specimen collection/handling, submission of specimen other than nasopharyngeal swab, presence of viral mutation(s) within the areas targeted by this assay, and inadequate number of viral copies(<138 copies/mL). A negative result must be combined with clinical observations, patient history, and epidemiological information. The expected result is Negative.  Fact Sheet for Patients:  BloggerCourse.com  Fact Sheet for Healthcare Providers:  SeriousBroker.it  This test is  no t yet approved or cleared by the [place] FDA and  has been authorized for detection and/or diagnosis of SARS-CoV-2 by FDA under an Emergency Use Authorization (EUA). This EUA will remain  in effect (meaning this test can be used) for the duration of the COVID-19 declaration under Section 564(b)(1) of the Act, 21 U.S.C.section 360bbb-3(b)(1), unless the authorization is terminated  or revoked sooner.       Influenza A by PCR NEGATIVE NEGATIVE Final   Influenza B by PCR NEGATIVE NEGATIVE Final    Comment: (NOTE) The Xpert Xpress SARS-CoV-2/FLU/RSV plus assay is intended as an aid in the diagnosis of influenza from Nasopharyngeal swab specimens and should not be used as a sole basis for treatment. Nasal washings and aspirates are unacceptable for Xpert Xpress SARS-CoV-2/FLU/RSV testing.  Fact Sheet for Patients: BloggerCourse.com  Fact Sheet for Healthcare Providers: SeriousBroker.it  This test is not yet approved or cleared by the [place] FDA and has been authorized for detection and/or diagnosis of SARS-CoV-2 by FDA under an Emergency Use Authorization (EUA). This EUA will remain in effect (meaning this test can be used) for the duration of the COVID-19 declaration under Section 564(b)(1) of the Act, 21 U.S.C. section 360bbb-3(b)(1), unless the authorization is terminated or revoked.  Performed at [facility], [address] [place], [place] [address]      Radiological Exams on Admission: CT HEAD WO CONTRAST  Result Date: 11/07/2020 CLINICAL DATA:  65 year old female with acute right side mouth and upper extremity numbness since 0700 hours. EXAM: CT HEAD WITHOUT CONTRAST TECHNIQUE: Contiguous axial images were obtained from the base of the skull through the vertex without intravenous contrast. COMPARISON:  None. FINDINGS: Brain: Patchy and confluent bilateral cerebral white matter hypodensity. Asymmetric  heterogeneity in the left thalamus on series 2, image 15. N mild asymmetry of the lateral ventricles appears to be normal anatomic variation. O midline shift, ventriculomegaly, mass effect, evidence of mass lesion, intracranial hemorrhage or evidence of cortically based acute infarction. No cortical encephalomalacia identified. Vascular: Calcified atherosclerosis at the skull base. No suspicious intracranial vascular hyperdensity. Skull: Negative. Sinuses/Orbits: Mild paranasal sinus mucosal thickening. Tympanic cavities and mastoids appear clear. Other: Visualized orbits and scalp soft tissues are within normal limits. IMPRESSION: 1. Fairly advanced bilateral white matter disease likely due to small vessel ischemia with associated age indeterminate changes in the left thalamus. 2. No acute cortically based infarct or acute intracranial hemorrhage identified. Electronically Signed   By: Odessa Fleming M.D.   On: 11/07/2020 10:22   MR ANGIO HEAD WO CONTRAST  Result Date: 11/07/2020 CLINICAL DATA:  Neuro deficit, acute stroke suspected. EXAM: MRI HEAD WITHOUT CONTRAST MRA HEAD WITHOUT CONTRAST TECHNIQUE: Multiplanar, multi-echo pulse sequences of the brain and surrounding structures were acquired without intravenous contrast. Angiographic images of the Circle of Willis were  acquired using MRA technique without intravenous contrast. COMPARISON: No pertinent prior exam. COMPARISON:  No pertinent prior exam. FINDINGS: MRI HEAD FINDINGS Brain: Acute left thalamocapsular infarct. Mild associated edema without mass effect. No hydrocephalus. No acute hemorrhage. No extra-axial fluid collection. No midline shift. Age advanced patchy T2/FLAIR hyperintensities within the white matter. Vascular: Evaluated below. Skull and upper cervical spine: Normal marrow signal. Sinuses/Orbits: Moderate mucosal thickening of scattered ethmoid air cells. Mild mucosal thickening of the maxillary sinuses. Left greater than right globe staphylomas.  Otherwise, unremarkable orbits. Other: No sizable mastoid effusion. MRA HEAD FINDINGS Anterior circulation: No large vessel occlusion. Moderate stenosis of the left internal carotid artery immediately proximal to the cavernous segment. 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 Otherwise, no proximal hemodynamically significant stenosis. Approximately 2 mm outpouching arising from the left supraclinoid ICA in the expected region of the posterior communicating artery without vessel arising from the tip, compatible with aneurysm versus with vessel too small to see by MRA (see series 12, images 103/104). Posterior circulation: Right dominant intradural vertebral artery. Patent basilar artery. Approximately 2 mm conical outpouching of the left basilar artery with the anterior inferior cerebellar artery (AICA) arising from the tip, compatible with infundibulum. Bilateral posterior cerebral arteries are patent with mild narrowing of the proximal right P2 PCA. Limited evaluation of the distal PCAs by MRA technique. IMPRESSION: MRI head: 1. Acute left thalamocapsular infarct. Mild associated edema without mass effect. 2. Age advanced patchy T2/FLAIR hyperintensities within the white matter, nonspecific but most likely related to chronic microvascular ischemic disease. MRA head: 1. 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 2. Moderate stenosis of the left internal carotid artery immediately proximal to the cavernous segment. 3. Mild right proximal P2 PCA stenosis. 4. 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 5. Approximately 2 mm left AICA infundubulum. Electronically Signed   By: Feliberto Harts MD   On: 11/07/2020 14:49   MR Brain Wo Contrast (neuro protocol)  Result Date: 11/07/2020 CLINICAL DATA:  Neuro deficit, acute stroke suspected. EXAM: MRI HEAD WITHOUT CONTRAST MRA HEAD WITHOUT CONTRAST TECHNIQUE: Multiplanar, multi-echo pulse  sequences of the brain and surrounding structures were acquired without intravenous contrast. Angiographic images of the Circle of Willis were acquired using MRA technique without intravenous contrast. COMPARISON: No pertinent prior exam. COMPARISON:  No pertinent prior exam. FINDINGS: MRI HEAD FINDINGS Brain: Acute left thalamocapsular infarct. Mild associated edema without mass effect. No hydrocephalus. No acute hemorrhage. No extra-axial fluid collection. No midline shift. Age advanced patchy T2/FLAIR hyperintensities within the white matter. Vascular: Evaluated below. Skull and upper cervical spine: Normal marrow signal. Sinuses/Orbits: Moderate mucosal thickening of scattered ethmoid air cells. Mild mucosal thickening of the maxillary sinuses. Left greater than right globe staphylomas. Otherwise, unremarkable orbits. Other: No sizable mastoid effusion. MRA HEAD FINDINGS Anterior circulation: No large vessel occlusion. Moderate stenosis of the left internal carotid artery immediately proximal to the cavernous segment. 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 Otherwise, no proximal hemodynamically significant stenosis. Approximately 2 mm outpouching arising from the left supraclinoid ICA in the expected region of the posterior communicating artery without vessel arising from the tip, compatible with aneurysm versus with vessel too small to see by MRA (see series 12, images 103/104). Posterior circulation: Right dominant intradural vertebral artery. Patent basilar artery. Approximately 2 mm conical outpouching of the left basilar artery with the anterior inferior cerebellar artery (AICA) arising from the tip, compatible  with infundibulum. Bilateral posterior cerebral arteries are patent with mild narrowing of the proximal right P2 PCA. Limited evaluation of the distal PCAs by MRA technique. IMPRESSION: MRI head: 1. Acute left thalamocapsular infarct. Mild associated edema without mass effect. 2. Age advanced  patchy T2/FLAIR hyperintensities within the white matter, nonspecific but most likely related to chronic microvascular ischemic disease. MRA head: 1. 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 2. Moderate stenosis of the left internal carotid artery immediately proximal to the cavernous segment. 3. Mild right proximal P2 PCA stenosis. 4. 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 5. Approximately 2 mm left AICA infundubulum. Electronically Signed   By: Feliberto HartsFrederick S Jones MD   On: 11/07/2020 14:49    EKG: Independently reviewed.  Sinus rhythm with RSR prime in V1 and V2.  Possible RVH.  Assessment/Plan: Principal Problem:   Acute stroke due to ischemia [facility](HCC) Active Problems:   Essential hypertension   High triglycerides   Type 2 diabetes mellitus with hyperglycemia, without long-term current use of insulin (HCC)    This patient was discussed with the ED physician, including pertinent vitals, physical exam findings, labs, and imaging.  We also discussed care given by the ED provider.  1. Acute Stroke Observation on telemetry MRI/MRA head done Carotid Dopplers  Echocardiogram tomorrow Hemoglobin A1c, lipid panel in the morning PT/OT/speech therapy consult Full aspirin Permissive HTN 2. Type 2 diabetes a. Continue home regimen b. SSI 3. Hypertension a. Permissive HTN  4. Hyperlipidemia a. Patient not on Statin - was taken off recently as cholesterol had improved.  DVT prophylaxis: Lovenox Consultants: Neurology Code Status: Full code Family Communication: None Disposition Plan: Patient should be able to return home   Levie HeritageStinson, Makeshia Seat J, DO

## 2020-11-07 NOTE — ED Provider Notes (Signed)
[facility]ENCY [facility]PARTMENT Provider Note   CSN: [id number] Arrival date & time: 11/07/20  [id number]     History Chief Complaint  Patient presents with  . Numbness    Marie Bell is a 65 y.o. female.  Patient seen by me in triage.  Past medical history sniffing for diabetes hyperlipidemia and hypertension.  Patient went to bed last evening around 2300.  Awoke this morning with numbness to her right index finger.  This is happened before.  Then she felt as if there was something wrong with the right side of her face and when she tried to walk her right leg was not working properly.  Leg is now back to normal.  Patient awoke this morning at approximately 0645 in the morning.  At about 715 when patient tried to get out of bed noticed that there was weakness to the right leg.  But as stated that is all resolved now except for the numbness to the right index finger no prior history of stroke.  She is able to ambulate in from the parking lot without any difficulties.  Past medical history significant for diabetes hyperlipidemia and hypertension.  Patient with no prior history of stroke.        Past Medical History:  Diagnosis Date  . DM (diabetes mellitus) (HCC)   . Hyperlipidemia   . Hypertension     Patient Active Problem List   Diagnosis Date Noted  . 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 Type 2 diabetes mellitus with hyperglycemia, without long-term current use of insulin (HCC) 10/12/2019  . Hypertriglyceridemia 10/12/2019  . Overweight 10/15/2016  . BMI 29.0-29.9,adult 10/15/2016  . Hyperlipemia, mixed 10/15/2016  . High triglycerides 10/15/2016  . TRIGGER FINGER 10/08/2007  . HIGH BLOOD PRESSURE 10/07/2007    Past Surgical History:  Procedure Laterality Date  . CHOLECYSTECTOMY    . TOTAL VAGINAL HYSTERECTOMY       OB History    Gravida      Para      Term      Preterm      AB      Living  2     SAB      IAB      Ectopic       Multiple      Live Births              Family History  Problem Relation Age of Onset  . Hypertension Mother   . Arthritis Mother   . Liver disease Father   . Alcoholism Father   . Diabetes Maternal Grandmother   . Colon cancer Maternal Grandfather     Social History   Tobacco Use  . Smoking status: Former Smoker    Types: E-cigarettes    Quit date: 06/18/2006    Years since quitting: 14.4  . Smokeless tobacco: Never Used  Vaping Use  . Vaping Use: Never used  Substance Use Topics  . Alcohol use: No  . Drug use: No    Home Medications Prior to Admission medications   Medication Sig Start Date End Date Taking? Authorizing Provider  bisoprolol-hydrochlorothiazide (ZIAC) 2.5-6.25 MG tablet Take 1 tablet by mouth daily.   Yes [provider]  glipiZIDE (GLUCOTROL) 5 MG tablet Take 1 tablet (5 mg total) by mouth daily before breakfast AND 2 tablets (10 mg total) daily before supper. Taking 1 tablet before breakfast and 1.5 tabs before Supper. Patient taking differently: Taking 1 tablet before  breakfast and 1.5 tabs before Supper. 11/03/20  Yes Shamleffer, Konrad Dolores, MD  losartan (COZAAR) 50 MG tablet Take 50 mg by mouth daily.   Yes [provider]  metFORMIN (GLUCOPHAGE) 1000 MG tablet TAKE 1 TABLET(1000 MG) BY MOUTH TWICE DAILY WITH A MEAL 10/19/20  Yes Shamleffer, Konrad Dolores, MD    Allergies    Simvastatin, Penicillins, Augmentin [amoxicillin-pot clavulanate], Cyclobenzaprine, and Sulfa antibiotics  Review of Systems   Review of Systems  Constitutional: Negative for chills and fever.  HENT: Negative for rhinorrhea and sore throat.   Eyes: Negative for visual disturbance.  Respiratory: Negative for cough and shortness of breath.   Cardiovascular: Negative for chest pain and leg swelling.  Gastrointestinal: Negative for abdominal pain, diarrhea, nausea and vomiting.  Genitourinary: Negative for dysuria.  Musculoskeletal: Negative for back  pain and neck pain.  Skin: Negative for rash.  Neurological: Positive for weakness and numbness. Negative for dizziness, light-headedness and headaches.  Hematological: Does not bruise/bleed easily.  Psychiatric/Behavioral: Negative for confusion.    Physical Exam Updated Vital Signs BP (!) 171/79   Pulse 71   Temp 97.9 F (36.6 C) (Oral)   Resp 18   Ht 1.676 m (5\' 6" )   Wt 83 kg   SpO2 99%   BMI 29.54 kg/m   Physical Exam Vitals and nursing note reviewed.  Constitutional:      General: She is not in acute distress.    Appearance: Normal appearance. She is well-developed.  HENT:     Head: Normocephalic and atraumatic.  Eyes:     Extraocular Movements: Extraocular movements intact.     Conjunctiva/sclera: Conjunctivae normal.     Pupils: Pupils are equal, round, and reactive to light.  Cardiovascular:     Rate and Rhythm: Normal rate and regular rhythm.     Heart sounds: No murmur heard.   Pulmonary:     Effort: Pulmonary effort is normal. No respiratory distress.     Breath sounds: Normal breath sounds.  Abdominal:     Palpations: Abdomen is soft.     Tenderness: There is no abdominal tenderness.  Musculoskeletal:        General: No swelling. Normal range of motion.     Cervical back: Normal range of motion and neck supple.  Skin:    General: Skin is warm and dry.  Neurological:     Mental Status: She is alert.     Comments: Numbness to right index finger.     ED Results / Procedures / Treatments   Labs (all labs ordered are listed, but only abnormal results are displayed) Labs Reviewed  COMPREHENSIVE METABOLIC PANEL - Abnormal; Notable for the following components:      Result Value   Glucose, Bld 139 (*)    AST 13 (*)    All other components within normal limits  URINALYSIS, ROUTINE W REFLEX MICROSCOPIC - Abnormal; Notable for the following components:   Color, Urine STRAW (*)    All other components within normal limits  CBG MONITORING, ED -  Abnormal; Notable for the following components:   Glucose-Capillary 168 (*)    All other components within normal limits  RESP PANEL BY RT-PCR (FLU A&B, COVID) ARPGX2  ETHANOL  PROTIME-INR  APTT  CBC  DIFFERENTIAL  RAPID URINE DRUG SCREEN, HOSP PERFORMED  I-STAT CHEM 8, ED    EKG EKG Interpretation  Date/Time:  Monday Nov 07 2020 10:12:24 EDT Ventricular Rate:  72 PR Interval:  190 QRS Duration: 106  QT Interval:  389 QTC Calculation: 426 R Axis:   35 Text Interpretation: Sinus rhythm RSR' in V1 or V2, right VCD or RVH Confirmed by Vanetta Mulders [phone]) on 11/07/2020 10:14:45 AM   Radiology CT HEAD WO CONTRAST  Result Date: 11/07/2020 CLINICAL DATA:  65 year old female with acute right side mouth and upper extremity numbness since 0700 hours. EXAM: CT HEAD WITHOUT CONTRAST TECHNIQUE: Contiguous axial images were obtained from the base of the skull through the vertex without intravenous contrast. COMPARISON:  None. FINDINGS: Brain: Patchy and confluent bilateral cerebral white matter hypodensity. Asymmetric heterogeneity in the left thalamus on series 2, image 15. N mild asymmetry of the lateral ventricles appears to be normal anatomic variation. O midline shift, ventriculomegaly, mass effect, evidence of mass lesion, intracranial hemorrhage or evidence of cortically based acute infarction. No cortical encephalomalacia identified. Vascular: Calcified atherosclerosis at the skull base. No suspicious intracranial vascular hyperdensity. Skull: Negative. Sinuses/Orbits: Mild paranasal sinus mucosal thickening. Tympanic cavities and mastoids appear clear. Other: Visualized orbits and scalp soft tissues are within normal limits. IMPRESSION: 1. Fairly advanced bilateral white matter disease likely due to small vessel ischemia with associated age indeterminate changes in the left thalamus. 2. No acute cortically based infarct or acute intracranial hemorrhage identified. Electronically Signed    By: Odessa Fleming M.D.   On: 11/07/2020 10:22   MR ANGIO HEAD WO CONTRAST  Result Date: 11/07/2020 CLINICAL DATA:  Neuro deficit, acute stroke suspected. EXAM: MRI HEAD WITHOUT CONTRAST MRA HEAD WITHOUT CONTRAST TECHNIQUE: Multiplanar, multi-echo pulse sequences of the brain and surrounding structures were acquired without intravenous contrast. Angiographic images of the Circle of Willis were acquired using MRA technique without intravenous contrast. COMPARISON: No pertinent prior exam. COMPARISON:  No pertinent prior exam. FINDINGS: MRI HEAD FINDINGS Brain: Acute left thalamocapsular infarct. Mild associated edema without mass effect. No hydrocephalus. No acute hemorrhage. No extra-axial fluid collection. No midline shift. Age advanced patchy T2/FLAIR hyperintensities within the white matter. Vascular: Evaluated below. Skull and upper cervical spine: Normal marrow signal. Sinuses/Orbits: Moderate mucosal thickening of scattered ethmoid air cells. Mild mucosal thickening of the maxillary sinuses. Left greater than right globe staphylomas. Otherwise, unremarkable orbits. Other: No sizable mastoid effusion. MRA HEAD FINDINGS Anterior circulation: No large vessel occlusion. Moderate stenosis of the left internal carotid artery immediately proximal to the cavernous segment. 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 Otherwise, no proximal hemodynamically significant stenosis. Approximately 2 mm outpouching arising from the left supraclinoid ICA in the expected region of the posterior communicating artery without vessel arising from the tip, compatible with aneurysm versus with vessel too small to see by MRA (see series 12, images 103/104). Posterior circulation: Right dominant intradural vertebral artery. Patent basilar artery. Approximately 2 mm conical outpouching of the left basilar artery with the anterior inferior cerebellar artery (AICA) arising from the tip, compatible with infundibulum. Bilateral posterior  cerebral arteries are patent with mild narrowing of the proximal right P2 PCA. Limited evaluation of the distal PCAs by MRA technique. IMPRESSION: MRI head: 1. Acute left thalamocapsular infarct. Mild associated edema without mass effect. 2. Age advanced patchy T2/FLAIR hyperintensities within the white matter, nonspecific but most likely related to chronic microvascular ischemic disease. MRA head: 1. 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 2. Moderate stenosis of the left internal carotid artery immediately proximal to the cavernous segment. 3. Mild right proximal P2 PCA stenosis. 4. 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 5. Approximately 2 mm left AICA infundubulum. Electronically Signed   By: Feliberto Harts MD   On: 11/07/2020 14:49   MR Brain Wo Contrast (neuro protocol)  Result Date: 11/07/2020 CLINICAL DATA:  Neuro deficit, acute stroke suspected. EXAM: MRI HEAD WITHOUT CONTRAST MRA HEAD WITHOUT CONTRAST TECHNIQUE: Multiplanar, multi-echo pulse sequences of the brain and surrounding structures were acquired without intravenous contrast. Angiographic images of the Circle of Willis were acquired using MRA technique without intravenous contrast. COMPARISON: No pertinent prior exam. COMPARISON:  No pertinent prior exam. FINDINGS: MRI HEAD FINDINGS Brain: Acute left thalamocapsular infarct. Mild associated edema without mass effect. No hydrocephalus. No acute hemorrhage. No extra-axial fluid collection. No midline shift. Age advanced patchy T2/FLAIR hyperintensities within the white matter. Vascular: Evaluated below. Skull and upper cervical spine: Normal marrow signal. Sinuses/Orbits: Moderate mucosal thickening of scattered ethmoid air cells. Mild mucosal thickening of the maxillary sinuses. Left greater than right globe staphylomas. Otherwise, unremarkable orbits. Other: No sizable mastoid effusion. MRA HEAD FINDINGS  Anterior circulation: No large vessel occlusion. Moderate stenosis of the left internal carotid artery immediately proximal to the cavernous segment. 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 Otherwise, no proximal hemodynamically significant stenosis. Approximately 2 mm outpouching arising from the left supraclinoid ICA in the expected region of the posterior communicating artery without vessel arising from the tip, compatible with aneurysm versus with vessel too small to see by MRA (see series 12, images 103/104). Posterior circulation: Right dominant intradural vertebral artery. Patent basilar artery. Approximately 2 mm conical outpouching of the left basilar artery with the anterior inferior cerebellar artery (AICA) arising from the tip, compatible with infundibulum. Bilateral posterior cerebral arteries are patent with mild narrowing of the proximal right P2 PCA. Limited evaluation of the distal PCAs by MRA technique. IMPRESSION: MRI head: 1. Acute left thalamocapsular infarct. Mild associated edema without mass effect. 2. Age advanced patchy T2/FLAIR hyperintensities within the white matter, nonspecific but most likely related to chronic microvascular ischemic disease. MRA head: 1. 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 2. Moderate stenosis of the left internal carotid artery immediately proximal to the cavernous segment. 3. Mild right proximal P2 PCA stenosis. 4. 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 5. Approximately 2 mm left AICA infundubulum. Electronically Signed   By: Feliberto Harts MD   On: 11/07/2020 14:49    Procedures Procedures    CRITICAL CARE Performed by: Vanetta Mulders Total critical care time: 45 minutes Critical care time was exclusive of separately billable procedures and treating other patients. Critical care was necessary to treat or prevent imminent or life-threatening  deterioration. Critical care was time spent personally by me on the following activities: development of treatment plan with patient and/or surrogate as well as nursing, discussions with consultants, evaluation of patient's response to treatment, examination of patient, obtaining history from patient or surrogate, ordering and performing treatments and interventions, ordering and review of laboratory studies, ordering and review of radiographic studies, pulse oximetry and re-evaluation of patient's condition.   Medications Ordered in ED Medications - No data to display  ED Course  I have reviewed the triage vital signs and the nursing notes.  Pertinent labs & imaging results that were available during my care of the patient were reviewed by me and considered in my medical decision making (see chart for details).    MDM Rules/Calculators/A&P  Patient still with numbness to the right index finger.  Sounds as if patient certainly had a CVA or TIA.  Prickly with the ambulatory problem with her right leg which is now resolved.  Patient not a candidate for tPA since last known normal at 2300.  But will do stroke work-up.  CT head raise concerns about thalamic stroke.  But was not sure how acute.  MRI confirmed the stroke.  Of thalamic capsule.  Discussed with Dr. Gerilyn Pilgrim on-call for neurology.  He will consult.  Discussed with hospitalist they will admit for acute stroke.  Patient's labs without any significant abnormalities COVID testing negative   Final Clinical Impression(s) / ED Diagnoses Final diagnoses:  Cerebrovascular accident (CVA), unspecified mechanism [facility])    Rx / DC Orders ED Discharge Orders    None       Vanetta Mulders, MD 11/07/20 [phone]

## 2020-11-07 NOTE — ED Triage Notes (Addendum)
Pt presents to ED with complaints of right index finger numbness and teeth numbness when she woke up this am at 0645, then about 0715 started having right arm numbness and difficultly walking with right leg. Pt states symptoms have resolved at this time.  Dr Deretha Emory at bedside.

## 2020-11-07 NOTE — ED Notes (Signed)
Dr Deretha Emory at bedside prior to [facility] complete.

## 2020-11-07 NOTE — ED Notes (Signed)
Pt removed from cardiac monitor to be taken to MRI.

## 2020-11-07 NOTE — ED Notes (Signed)
Pt returned from MRI and RN went to place pt back on cardiac monitor. Pt refused to be placed on monitor. RN explained importance of having the cardiac monitor on but pt still refused. She reports she will allow periodic vital checks.

## 2020-11-08 ENCOUNTER — Observation Stay (HOSPITAL_COMMUNITY): Payer: PRIVATE HEALTH INSURANCE

## 2020-11-08 ENCOUNTER — Observation Stay (HOSPITAL_BASED_OUTPATIENT_CLINIC_OR_DEPARTMENT_OTHER): Payer: PRIVATE HEALTH INSURANCE

## 2020-11-08 DIAGNOSIS — I639 Cerebral infarction, unspecified: Secondary | ICD-10-CM

## 2020-11-08 DIAGNOSIS — I6389 Other cerebral infarction: Secondary | ICD-10-CM

## 2020-11-08 DIAGNOSIS — E1169 Type 2 diabetes mellitus with other specified complication: Secondary | ICD-10-CM | POA: Diagnosis present

## 2020-11-08 DIAGNOSIS — Z720 Tobacco use: Secondary | ICD-10-CM | POA: Diagnosis present

## 2020-11-08 LAB — GLUCOSE, CAPILLARY
Glucose-Capillary: 203 mg/dL — ABNORMAL HIGH (ref 70–99)
Glucose-Capillary: 142 mg/dL — ABNORMAL HIGH (ref 70–99)

## 2020-11-08 LAB — ECHOCARDIOGRAM COMPLETE
Area-P 1/2: 5.13 cm2
Height: 65.5 in
S' Lateral: 2.81 cm
Weight: 2927.71 oz

## 2020-11-08 LAB — LIPID PANEL
Cholesterol: 225 mg/dL — ABNORMAL HIGH (ref 0–200)
HDL: 35 mg/dL — ABNORMAL LOW (ref >40)
LDL Cholesterol: UNDETERMINED mg/dL (ref 0–99)
Total CHOL/HDL Ratio: 6.4 RATIO
Triglycerides: 716 mg/dL — ABNORMAL HIGH (ref <150)
VLDL: UNDETERMINED mg/dL (ref 0–40)

## 2020-11-08 LAB — HEMOGLOBIN A1C
Hgb A1c MFr Bld: 7.1 % — ABNORMAL HIGH (ref 4.8–5.6)
Mean Plasma Glucose: 157 mg/dL

## 2020-11-08 LAB — HIV ANTIBODY (ROUTINE TESTING W REFLEX): HIV Screen 4th Generation wRfx: NONREACTIVE

## 2020-11-08 LAB — LDL CHOLESTEROL, DIRECT: Direct LDL: 82.7 mg/dL (ref 0–99)

## 2020-11-08 MED ORDER — ATORVASTATIN CALCIUM 40 MG PO TABS: 40.0000 mg | ORAL_TABLET | Freq: Every day | ORAL | 5 refills | Status: DC

## 2020-11-08 MED ORDER — METFORMIN HCL 1000 MG PO TABS: 1000.0000 mg | ORAL_TABLET | Freq: Two times a day (BID) | ORAL | 3 refills | Status: DC

## 2020-11-08 MED ORDER — FENOFIBRATE 160 MG PO TABS
160.0000 mg | ORAL_TABLET | Freq: Every day | ORAL | Status: DC
  Administered 2020-11-08: 160 mg via ORAL
  Filled 2020-11-08: qty 1

## 2020-11-08 MED ORDER — ASPIRIN EC 81 MG PO TBEC: 81.0000 mg | DELAYED_RELEASE_TABLET | Freq: Every day | ORAL | 0 refills | Status: AC

## 2020-11-08 MED ORDER — CLOPIDOGREL BISULFATE 75 MG PO TABS: 75.0000 mg | ORAL_TABLET | Freq: Every day | ORAL | 11 refills | Status: AC

## 2020-11-08 MED ORDER — FENOFIBRATE 160 MG PO TABS: 160.0000 mg | ORAL_TABLET | Freq: Every day | ORAL | 2 refills | Status: AC

## 2020-11-08 MED ORDER — ATORVASTATIN CALCIUM 40 MG PO TABS
40.0000 mg | ORAL_TABLET | Freq: Every day | ORAL | Status: DC
  Administered 2020-11-08: 40 mg via ORAL
  Filled 2020-11-08: qty 1

## 2020-11-08 NOTE — Progress Notes (Signed)
Nsg Discharge Note  Admit Date:  11/07/2020 Discharge date: 11/08/2020   Marie Bell to be D/C'd home per MD order.  AVS completed.  Copy for chart, and copy for patient signed, and dated. Patient/caregiver able to verbalize understanding.  Discharge Medication: Allergies as of 11/08/2020      Reactions   Simvastatin Other (See Comments)   Leg cramps   Penicillins    Augmentin [amoxicillin-pot Clavulanate] Rash   Cyclobenzaprine Palpitations   Body aches,nausea,weakness   Sulfa Antibiotics Rash      Medication List    TAKE these medications   aspirin EC 81 MG tablet Take 1 tablet (81 mg total) by mouth daily with breakfast. Please take Aspirin 81 mg daily along with Plavix 75 mg daily for 30 days then after that STOP the aspirin and continue ONLY Plavix 75 mg daily indefinitely--for stroke prevention   atorvastatin 40 MG tablet Commonly known as: LIPITOR Take 1 tablet (40 mg total) by mouth daily. Start taking on: Nov 09, 2020   bisoprolol-hydrochlorothiazide 2.5-6.25 MG tablet Commonly known as: ZIAC Take 1 tablet by mouth daily.   clopidogrel 75 MG tablet Commonly known as: Plavix Take 1 tablet (75 mg total) by mouth daily. Please take Aspirin 81 mg daily along with Plavix 75 mg daily for 30 days then after that STOP the aspirin and continue ONLY Plavix 75 mg daily indefinitely--for stroke prevention   fenofibrate 160 MG tablet Take 1 tablet (160 mg total) by mouth daily. Start taking on: Nov 09, 2020   glipiZIDE 5 MG tablet Commonly known as: GLUCOTROL Take 1 tablet (5 mg total) by mouth daily before breakfast AND 2 tablets (10 mg total) daily before supper. Taking 1 tablet before breakfast and 1.5 tabs before Supper. What changed: See the new instructions.   losartan 50 MG tablet Commonly known as: COZAAR Take 50 mg by mouth daily.   metFORMIN 1000 MG tablet Commonly known as: GLUCOPHAGE Take 1 tablet (1,000 mg total) by mouth 2 (two) times daily with a  meal. Start taking on: Nov 09, 2020 What changed: See the new instructions.       Discharge Assessment: Vitals:   11/08/20 0124 11/08/20 0610  BP: (!) 152/71 (!) 155/85  Pulse: 70 69  Resp:  18  Temp: 97.6 F (36.4 C) (!) 97.4 F (36.3 C)  SpO2: 96% 98%   Skin clean, dry and intact without evidence of skin break down, no evidence of skin tears noted. IV catheter discontinued intact. Site without signs and symptoms of complications - no redness or edema noted at insertion site, patient denies c/o pain - only slight tenderness at site.  Dressing with slight pressure applied.  D/c Instructions-Education: Discharge instructions given to patient/family with verbalized understanding. D/c education completed with patient/family including follow up instructions, medication list, d/c activities limitations if indicated, with other d/c instructions as indicated by MD - patient able to verbalize understanding, all questions fully answered. Patient instructed to return to ED, call 911, or call MD for any changes in condition.  Patient escorted via WC, and D/C home via private auto.  Demetrio Lapping, LPN 9/83/3825 0:53 PM

## 2020-11-08 NOTE — Discharge Summary (Signed)
Marie Bell, is a 65 y.o. female  DOB 1955/12/15  MRN [id number].  Admission date:  11/07/2020  Admitting Physician  Levie Heritage, DO  Discharge Date:  11/08/2020   Primary MD  Assunta Found, MD  Recommendations for primary care physician for things to follow:   1)Please take Aspirin 81 mg daily along with Plavix 75 mg daily for 30 days then after that STOP the aspirin and continue ONLY Plavix 75 mg daily indefinitely--for stroke prevention  2)Avoid ibuprofen/Advil/Aleve/Motrin/Goody Powders/Naproxen/BC powders/Meloxicam/Diclofenac/Indomethacin and other Nonsteroidal anti-inflammatory medications as these will make you more likely to bleed and can cause stomach ulcers, can also cause Kidney problems.   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 Beryle Beams-- Phone: [phone], Address: [address], [place], [place] [address] in 4 weeks for recheck and reevaluation.  Please call to make appointment with him    Admission Diagnosis  Cerebrovascular accident (CVA), unspecified mechanism (HCC) [I63.9] Acute stroke due to ischemia [facility]) [I63.9]   Discharge Diagnosis  Cerebrovascular accident (CVA), unspecified mechanism (HCC) [I63.9] Acute stroke due to ischemia [facility]) [I63.9]   Principal Problem:   Acute stroke due to ischemia/ Acute left thalamocapsular infarct. 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 DM (diabetes mellitus) (HCC)   . Hyperlipidemia   . Hypertension     Past Surgical History:  Procedure Laterality Date  . CHOLECYSTECTOMY    . TOTAL VAGINAL HYSTERECTOMY       HPI  from the history and physical done on the day of admission:    Patient Coming From: Home  Chief Complaint: Numbness in right hand  HPI: Marie Bell is a 65 y.o. female with a history of diabetes, hypertension, hyperlipidemia.  Patient is an occasional smoker.  Patient woke up this morning around 645 to numbness in her right index finger and "something wrong with the right side of her face".  She had difficulty walking first thing in this morning with weakness in her right leg.  As time went on, her leg weakness improved, but she continues to have numbness in the right finger.  There were no palliating or provoking factors.  Emergency Department Course: Normal white matter disease possibly due to small vessel ischemia.  MRI done showing left thalamic capsular infarct with severe stenosis of distal right A1  ACA and moderate stenosis of left internal carotid artery  Review of Systems:   Pt denies any fevers, chills, nausea, vomiting, diarrhea, constipation, abdominal pain, shortness of breath, dyspnea on exertion, orthopnea, cough, wheezing, palpitations, headache, vision changes, lightheadedness, dizziness, melena, rectal bleeding.  Review of systems are otherwise negative    Hospital Course:      1)Acute CVA/Acute left thalamocapsular infarct -MRA/MRI head without LVO  Severe stenosis of the distal right A1 ACA,   Moderate stenosis of the left internal carotid artery immediately proximal to the cavernous segment.  Mild right proximal P2 PCA stenosis.  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  Approximately 2 mm left AICA infundubulum. -Aspirin 81 mg daily along with Plavix 75 mg daily for 30 days then after that STOP the aspirin and continue ONLY Plavix 75 mg daily indefinitely--for stroke prevention - A1c was 7.1 -Lipid profile with severe hypertriglyceridemia--- Lipitor and fenofibrate advised -Outpatient follow-up with neurologist Dr. Gerilyn Pilgrim advised  2)HTN/DM2-the need to improve diabetic and hypertension control would like to reduce risk for further stroke emphasized  3) dyslipidemia/hypertriglyceridemia----fenofibrate and Lipitor as advised, lifestyle and dietary modifications  4) tobacco abuse--- smoking cessation advised   Discharge Condition: Stable  Follow UP   Follow-up Information    Beryle Beams, MD. Schedule an appointment as soon as possible for a visit in 4 week(s).   Specialty: Neurology Contact information: [address] [place] [address] [phone]               Diet and Activity recommendation:  As advised  Discharge Instructions    Discharge Instructions    Call MD for:  difficulty breathing, headache or visual disturbances   Complete by: As directed    Call MD for:  persistant dizziness or light-headedness   Complete by: As directed    Call MD for:  persistant nausea and vomiting   Complete by: As directed    Call MD for:  temperature >100.4   Complete by: As directed    Diet - low sodium heart healthy   Complete by: As directed    Discharge instructions   Complete by: As directed    1)Please take Aspirin 81 mg daily along with Plavix 75 mg daily for 30 days then after that STOP the aspirin and continue ONLY Plavix 75 mg daily indefinitely--for stroke prevention  2)Avoid ibuprofen/Advil/Aleve/Motrin/Goody Powders/Naproxen/BC powders/Meloxicam/Diclofenac/Indomethacin and other Nonsteroidal anti-inflammatory  medications as these will make you more likely to bleed and can cause stomach ulcers, can also cause Kidney problems.   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 Beryle Beams-- Phone: [phone], Address: [address], [place], [place] [address] in 4 weeks for recheck and reevaluation.  Please call to make appointment with him   Increase activity slowly   Complete by: As directed         Discharge Medications     Allergies as of 11/08/2020      Reactions   Simvastatin Other (See Comments)   Leg cramps   Penicillins  Augmentin [amoxicillin-pot Clavulanate] Rash   Cyclobenzaprine Palpitations   Body aches,nausea,weakness   Sulfa Antibiotics Rash      Medication List    TAKE these medications   aspirin EC 81 MG tablet Take 1 tablet (81 mg total) by mouth daily with breakfast. Please take Aspirin 81 mg daily along with Plavix 75 mg daily for 30 days then after that STOP the aspirin and continue ONLY Plavix 75 mg daily indefinitely--for stroke prevention   atorvastatin 40 MG tablet Commonly known as: LIPITOR Take 1 tablet (40 mg total) by mouth daily. Start taking on: Nov 09, 2020   bisoprolol-hydrochlorothiazide 2.5-6.25 MG tablet Commonly known as: ZIAC Take 1 tablet by mouth daily.   clopidogrel 75 MG tablet Commonly known as: Plavix Take 1 tablet (75 mg total) by mouth daily. Please take Aspirin 81 mg daily along with Plavix 75 mg daily for 30 days then after that STOP the aspirin and continue ONLY Plavix 75 mg daily indefinitely--for stroke prevention   fenofibrate  160 MG tablet Take 1 tablet (160 mg total) by mouth daily. Start taking on: Nov 09, 2020   glipiZIDE 5 MG tablet Commonly known as: GLUCOTROL Take 1 tablet (5 mg total) by mouth daily before breakfast AND 2 tablets (10 mg total) daily before supper. Taking 1 tablet before breakfast and 1.5 tabs before Supper. What changed: See the new instructions.   losartan 50 MG tablet Commonly known as: COZAAR Take 50 mg by mouth daily.   metFORMIN 1000 MG tablet Commonly known as: GLUCOPHAGE Take 1 tablet (1,000 mg total) by mouth 2 (two) times daily with a meal. Start taking on: Nov 09, 2020 What changed: See the new instructions.       Major procedures and Radiology Reports - PLEASE review detailed and final reports for all details, in brief -   CT HEAD WO CONTRAST  Result Date: 11/07/2020 CLINICAL DATA:  65 year old female with acute right side mouth and upper extremity numbness since 0700 hours. EXAM: CT HEAD WITHOUT CONTRAST TECHNIQUE: Contiguous axial images were obtained from the base of the skull through the vertex without intravenous contrast. COMPARISON:  None. FINDINGS: Brain: Patchy and confluent bilateral cerebral white matter hypodensity. Asymmetric heterogeneity in the left thalamus on series 2, image 15. N mild asymmetry of the lateral ventricles appears to be normal anatomic variation. O midline shift, ventriculomegaly, mass effect, evidence of mass lesion, intracranial hemorrhage or evidence of cortically based acute infarction. No cortical encephalomalacia identified. Vascular: Calcified atherosclerosis at the skull base. No suspicious intracranial vascular hyperdensity. Skull: Negative. Sinuses/Orbits: Mild paranasal sinus mucosal thickening. Tympanic cavities and mastoids appear clear. Other: Visualized orbits and scalp soft tissues are within normal limits. IMPRESSION: 1. Fairly advanced bilateral white matter disease likely due to small vessel ischemia with associated age  indeterminate changes in the left thalamus. 2. No acute cortically based infarct or acute intracranial hemorrhage identified. Electronically Signed   By: Odessa Fleming M.D.   On: 11/07/2020 10:22   MR ANGIO HEAD WO CONTRAST  Result Date: 11/07/2020 CLINICAL DATA:  Neuro deficit, acute stroke suspected. EXAM: MRI HEAD WITHOUT CONTRAST MRA HEAD WITHOUT CONTRAST TECHNIQUE: Multiplanar, multi-echo pulse sequences of the brain and surrounding structures were acquired without intravenous contrast. Angiographic images of the Circle of Willis were acquired using MRA technique without intravenous contrast. COMPARISON: No pertinent prior exam. COMPARISON:  No pertinent prior exam. FINDINGS: MRI HEAD FINDINGS Brain: Acute left thalamocapsular infarct. Mild associated edema without mass effect. No hydrocephalus. No acute  hemorrhage. No extra-axial fluid collection. No midline shift. Age advanced patchy T2/FLAIR hyperintensities within the white matter. Vascular: Evaluated below. Skull and upper cervical spine: Normal marrow signal. Sinuses/Orbits: Moderate mucosal thickening of scattered ethmoid air cells. Mild mucosal thickening of the maxillary sinuses. Left greater than right globe staphylomas. Otherwise, unremarkable orbits. Other: No sizable mastoid effusion. MRA HEAD FINDINGS Anterior circulation: No large vessel occlusion. Moderate stenosis of the left internal carotid artery immediately proximal to the cavernous segment. 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 Otherwise, no proximal hemodynamically significant stenosis. Approximately 2 mm outpouching arising from the left supraclinoid ICA in the expected region of the posterior communicating artery without vessel arising from the tip, compatible with aneurysm versus with vessel too small to see by MRA (see series 12, images 103/104). Posterior circulation: Right dominant intradural vertebral artery. Patent basilar artery. Approximately 2 mm conical outpouching of the  left basilar artery with the anterior inferior cerebellar artery (AICA) arising from the tip, compatible with infundibulum. Bilateral posterior cerebral arteries are patent with mild narrowing of the proximal right P2 PCA. Limited evaluation of the distal PCAs by MRA technique. IMPRESSION: MRI head: 1. Acute left thalamocapsular infarct. Mild associated edema without mass effect. 2. Age advanced patchy T2/FLAIR hyperintensities within the white matter, nonspecific but most likely related to chronic microvascular ischemic disease. MRA head: 1. 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 2. Moderate stenosis of the left internal carotid artery immediately proximal to the cavernous segment. 3. Mild right proximal P2 PCA stenosis. 4. 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 5. Approximately 2 mm left AICA infundubulum. Electronically Signed   By: Feliberto Harts MD   On: 11/07/2020 14:49   MR Brain Wo Contrast (neuro protocol)  Result Date: 11/07/2020 CLINICAL DATA:  Neuro deficit, acute stroke suspected. EXAM: MRI HEAD WITHOUT CONTRAST MRA HEAD WITHOUT CONTRAST TECHNIQUE: Multiplanar, multi-echo pulse sequences of the brain and surrounding structures were acquired without intravenous contrast. Angiographic images of the Circle of Willis were acquired using MRA technique without intravenous contrast. COMPARISON: No pertinent prior exam. COMPARISON:  No pertinent prior exam. FINDINGS: MRI HEAD FINDINGS Brain: Acute left thalamocapsular infarct. Mild associated edema without mass effect. No hydrocephalus. No acute hemorrhage. No extra-axial fluid collection. No midline shift. Age advanced patchy T2/FLAIR hyperintensities within the white matter. Vascular: Evaluated below. Skull and upper cervical spine: Normal marrow signal. Sinuses/Orbits: Moderate mucosal thickening of scattered ethmoid air cells. Mild mucosal thickening of the  maxillary sinuses. Left greater than right globe staphylomas. Otherwise, unremarkable orbits. Other: No sizable mastoid effusion. MRA HEAD FINDINGS Anterior circulation: No large vessel occlusion. Moderate stenosis of the left internal carotid artery immediately proximal to the cavernous segment. 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 Otherwise, no proximal hemodynamically significant stenosis. Approximately 2 mm outpouching arising from the left supraclinoid ICA in the expected region of the posterior communicating artery without vessel arising from the tip, compatible with aneurysm versus with vessel too small to see by MRA (see series 12, images 103/104). Posterior circulation: Right dominant intradural vertebral artery. Patent basilar artery. Approximately 2 mm conical outpouching of the left basilar artery with the anterior inferior cerebellar artery (AICA) arising from the tip, compatible with infundibulum. Bilateral posterior cerebral arteries are patent with mild narrowing of the proximal right P2 PCA. Limited evaluation of the distal PCAs by MRA technique. IMPRESSION: MRI head: 1. Acute left thalamocapsular infarct. Mild associated edema  without mass effect. 2. Age advanced patchy T2/FLAIR hyperintensities within the white matter, nonspecific but most likely related to chronic microvascular ischemic disease. MRA head: 1. 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 2. Moderate stenosis of the left internal carotid artery immediately proximal to the cavernous segment. 3. Mild right proximal P2 PCA stenosis. 4. 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 5. Approximately 2 mm left AICA infundubulum. Electronically Signed   By: Feliberto Harts MD   On: 11/07/2020 14:49   US Carotid Bilateral (at [facility] and [facility] only)  Result Date: 11/08/2020 CLINICAL DATA:  Hypertension, stroke symptoms, hyperlipidemia, right-sided numbness  EXAM: BILATERAL CAROTID DUPLEX ULTRASOUND TECHNIQUE: Wallace Cullens scale imaging, color Doppler and duplex ultrasound were performed of bilateral carotid and vertebral arteries in the neck. COMPARISON:  None. FINDINGS: Criteria: Quantification of carotid stenosis is based on velocity parameters that correlate the residual internal carotid diameter with NASCET-based stenosis levels, using the diameter of the distal internal carotid lumen as the denominator for stenosis measurement. The following velocity measurements were obtained: RIGHT ICA: 111/26 cm/sec CCA: 117/14 cm/sec SYSTOLIC ICA/CCA RATIO:  0.9 ECA: 106 cm/sec LEFT ICA: 84/21 cm/sec CCA: 93/19 cm/sec SYSTOLIC ICA/CCA RATIO:  0.9 ECA: 177 cm/sec RIGHT CAROTID ARTERY: Minor intimal thickening and heterogeneous plaque formation. No hemodynamically significant right ICA stenosis, velocity elevation, or turbulent flow. Degree of narrowing less than 50%. RIGHT VERTEBRAL ARTERY:  Normal antegrade flow LEFT CAROTID ARTERY: Similar intimal thickening and scattered moderate plaque formation. No hemodynamically significant left ICA stenosis, velocity elevation, or turbulent flow. LEFT VERTEBRAL ARTERY:  Normal antegrade flow IMPRESSION: Bilateral carotid atherosclerosis. No hemodynamically significant ICA stenosis. Degree of narrowing less than 50% bilaterally by ultrasound criteria. Patent antegrade vertebral flow bilaterally Electronically Signed   By: Judie Petit.  Shick M.D.   On: 11/08/2020 10:00   ECHOCARDIOGRAM COMPLETE  Result Date: 11/08/2020    ECHOCARDIOGRAM REPORT   Patient Name:   CAIDENCE KASEMAN Date of Exam: 11/08/2020 Medical Rec #:  [id number]       Height:       65.5 in Accession #:    [id number]      Weight:       183.0 lb Date of Birth:  12/12/1955        BSA:          1.916 m Patient Age:    64 years        BP:           155/85 mmHg Patient Gender: F               HR:           69 bpm. [facility] Procedure: 2D Echo Indications:    Stroke I63.9   History:        Patient has no prior history of Echocardiogram examinations.                 Stroke; Risk Factors:Former Smoker, Diabetes, Hypertension and                 Dyslipidemia.  Sonographer:    Jeryl Columbia RDCS (AE) Referring Phys: [id number] JACOB J STINSON IMPRESSIONS  1. Left ventricular ejection fraction, by estimation, is 55 to 60%. The left ventricle has normal function. The left ventricle has no regional wall motion abnormalities. There is mild left ventricular hypertrophy. Left ventricular diastolic parameters are indeterminate.  2. Right ventricular  systolic function is normal. The right ventricular size is normal. There is normal pulmonary artery systolic pressure. The estimated right ventricular systolic pressure is 12.7 mmHg.  3. There is a trivial pericardial effusion anterior to the right ventricle.  4. The mitral valve is grossly normal. Trivial mitral valve regurgitation.  5. The aortic valve is tricuspid. Aortic valve regurgitation is not visualized.  6. The inferior vena cava is normal in size with greater than 50% respiratory variability, suggesting right atrial pressure of 3 mmHg. FINDINGS  Left Ventricle: Left ventricular ejection fraction, by estimation, is 55 to 60%. The left ventricle has normal function. The left ventricle has no regional wall motion abnormalities. The left ventricular internal cavity size was normal in size. There is  mild left ventricular hypertrophy. Left ventricular diastolic parameters are indeterminate. Right Ventricle: The right ventricular size is normal. No increase in right ventricular wall thickness. Right ventricular systolic function is normal. There is normal pulmonary artery systolic pressure. The tricuspid regurgitant velocity is 1.56 m/s, and  with an assumed right atrial pressure of 3 mmHg, the estimated right ventricular systolic pressure is 12.7 mmHg. Left Atrium: Left atrial size was normal in size. Right Atrium: Right atrial size was normal in  size. Pericardium: Trivial pericardial effusion is present. The pericardial effusion is anterior to the right ventricle. Mitral Valve: The mitral valve is grossly normal. Mild mitral annular calcification. Trivial mitral valve regurgitation. Tricuspid Valve: The tricuspid valve is grossly normal. Tricuspid valve regurgitation is trivial. Aortic Valve: The aortic valve is tricuspid. There is mild aortic valve annular calcification. Aortic valve regurgitation is not visualized. Pulmonic Valve: The pulmonic valve was grossly normal. Pulmonic valve regurgitation is trivial. Aorta: The aortic root is normal in size and structure. Venous: The inferior vena cava is normal in size with greater than 50% respiratory variability, suggesting right atrial pressure of 3 mmHg. IAS/Shunts: No atrial level shunt detected by color flow Doppler.  LEFT VENTRICLE PLAX 2D LVIDd:         4.78 cm  Diastology LVIDs:         2.81 cm  LV e' medial:    5.96 cm/s LV PW:         1.35 cm  LV E/e' medial:  16.2 LV IVS:        1.19 cm  LV e' lateral:   8.85 cm/s LVOT diam:     1.80 cm  LV E/e' lateral: 10.9 LVOT Area:     2.54 cm  RIGHT VENTRICLE RV S prime:     18.20 cm/s TAPSE (M-mode): 2.5 cm LEFT ATRIUM             Index       RIGHT ATRIUM           Index LA diam:        3.50 cm 1.83 cm/m  RA Area:     15.60 cm LA Vol (A2C):   30.4 ml 15.87 ml/m RA Volume:   45.40 ml  23.70 ml/m LA Vol (A4C):   22.8 ml 11.90 ml/m LA Biplane Vol: 27.7 ml 14.46 ml/m   AORTA Ao Root diam: 3.00 cm MITRAL VALVE                TRICUSPID VALVE MV Area (PHT): 5.13 cm     TR Peak grad:   9.7 mmHg MV Decel Time: 148 msec     TR Vmax:        156.00 cm/s MV E velocity:  96.70 cm/s MV A velocity: 129.00 cm/s  SHUNTS MV E/A ratio:  0.75         Systemic Diam: 1.80 cm Nona Dell MD Electronically signed by Nona Dell MD Signature Date/Time: 11/08/2020/11:52:01 AM    Final     Micro Results  Recent Results (from the past 240 hour(s))  Resp Panel by RT-PCR  (Flu A&B, Covid) Nasopharyngeal Swab     Status: None   Collection Time: 11/07/20  9:41 AM   Specimen: Nasopharyngeal Swab; Nasopharyngeal(NP) swabs in vial transport medium  Result Value Ref Range Status   SARS Coronavirus 2 by RT PCR NEGATIVE NEGATIVE Final    Comment: (NOTE) SARS-CoV-2 target nucleic acids are NOT DETECTED.  The SARS-CoV-2 RNA is generally detectable in upper respiratory specimens during the acute phase of infection. The lowest concentration of SARS-CoV-2 viral copies this assay can detect is 138 copies/mL. A negative result does not preclude SARS-Cov-2 infection and should not be used as the sole basis for treatment or other patient management decisions. A negative result may occur with  improper specimen collection/handling, submission of specimen other than nasopharyngeal swab, presence of viral mutation(s) within the areas targeted by this assay, and inadequate number of viral copies(<138 copies/mL). A negative result must be combined with clinical observations, patient history, and epidemiological information. The expected result is Negative.  Fact Sheet for Patients:  BloggerCourse.com  Fact Sheet for Healthcare Providers:  SeriousBroker.it  This test is no t yet approved or cleared by the [place] FDA and  has been authorized for detection and/or diagnosis of SARS-CoV-2 by FDA under an Emergency Use Authorization (EUA). This EUA will remain  in effect (meaning this test can be used) for the duration of the COVID-19 declaration under Section 564(b)(1) of the Act, 21 U.S.C.section 360bbb-3(b)(1), unless the authorization is terminated  or revoked sooner.       Influenza A by PCR NEGATIVE NEGATIVE Final   Influenza B by PCR NEGATIVE NEGATIVE Final    Comment: (NOTE) The Xpert Xpress SARS-CoV-2/FLU/RSV plus assay is intended as an aid in the diagnosis of influenza from Nasopharyngeal swab specimens  and should not be used as a sole basis for treatment. Nasal washings and aspirates are unacceptable for Xpert Xpress SARS-CoV-2/FLU/RSV testing.  Fact Sheet for Patients: BloggerCourse.com  Fact Sheet for Healthcare Providers: SeriousBroker.it  This test is not yet approved or cleared by the [place] FDA and has been authorized for detection and/or diagnosis of SARS-CoV-2 by FDA under an Emergency Use Authorization (EUA). This EUA will remain in effect (meaning this test can be used) for the duration of the COVID-19 declaration under Section 564(b)(1) of the Act, 21 U.S.C. section 360bbb-3(b)(1), unless the authorization is terminated or revoked.  Performed at [facility], [address] [place], [place] [address]    Today   Subjective    Quantia Grullon today has no new complaints No fever  Or chills   No Nausea, Vomiting or Diarrhea -Husband at bedside, questions answered        Patient has been seen and examined prior to discharge   Objective   Blood pressure (!) 155/85, pulse 69, temperature (!) 97.4 F (36.3 C), temperature source Oral, resp. rate 18, height 5' 5.5" (1.664 m), weight 83 kg, SpO2 98 %.   Intake/Output Summary (Last 24 hours) at 11/08/2020 1309 Last data filed at 11/08/2020 0900 Gross per 24 hour  Intake 480 ml  Output --  Net 480 ml    Exam  Gen:- Awake Alert, no acute distress  HEENT:- [place].AT, No sclera icterus Neck-Supple Neck,No JVD,.  Lungs-  CTAB , good air movement bilaterally  CV- S1, S2 normal, regular Abd-  +ve B.Sounds, Abd Soft, No tenderness,    Extremity/Skin:- No  edema,   good pulses Psych-affect is appropriate, oriented x3 Neuro-right-sided neurodeficits appears to be resolving, no new focal deficits, no tremors    Data Review   CBC w Diff:  Lab Results  Component Value Date   WBC 8.1 11/07/2020   HGB 13.8 11/07/2020   HCT 41.9 11/07/2020   PLT 246 11/07/2020    LYMPHOPCT 27 11/07/2020   MONOPCT 7 11/07/2020   EOSPCT 6 11/07/2020   BASOPCT 1 11/07/2020    CMP:  Lab Results  Component Value Date   NA 135 11/07/2020   K 3.7 11/07/2020   CL 99 11/07/2020   CO2 26 11/07/2020   BUN 8 11/07/2020   CREATININE 0.60 11/07/2020   PROT 6.8 11/07/2020   ALBUMIN 4.0 11/07/2020   BILITOT 0.4 11/07/2020   ALKPHOS 54 11/07/2020   AST 13 (L) 11/07/2020   ALT 19 11/07/2020  .   Total Discharge time is about 33 minutes  Shon Hale M.D on 11/08/2020 at 1:09 PM  Go to [email or web address] -  for contact info  [facility] - Office  [phone]

## 2020-11-08 NOTE — Progress Notes (Signed)
*  PRELIMINARY RESULTS* Echocardiogram 2D Echocardiogram has been performed.  Marie Bell 11/08/2020, 9:30 AM

## 2020-11-08 NOTE — Discharge Instructions (Signed)
1)Please take Aspirin 81 mg daily along with Plavix 75 mg daily for 30 days then after that STOP the aspirin and continue ONLY Plavix 75 mg daily indefinitely--for stroke prevention  2)Avoid ibuprofen/Advil/Aleve/Motrin/Goody Powders/Naproxen/BC powders/Meloxicam/Diclofenac/Indomethacin and other Nonsteroidal anti-inflammatory medications as these will make you more likely to bleed and can cause stomach ulcers, can also cause Kidney problems.   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 Beryle Beams-- Phone: [phone], Address: [address], [place], [place] [address] in 4 weeks for recheck and reevaluation.  Please call to make appointment with him

## 2020-11-08 NOTE — Progress Notes (Signed)
CCMD called and stated patients telemetry leads were off.  Patient is currently refusing care from primary RN, no stroke reassessment and vital signs. Patient refusing telmetry at this time as well.   MD notified of patient behavior.

## 2020-11-08 NOTE — Progress Notes (Signed)
Patient is currently refusing insulin, stroke assessment, and vital signs. Patient states that she feels like she is able to go home and desires to leave today. Patient demands that tech come to do tests this morning so she may leave. It was explained to the patient that there is no specific time for testing as the techs service the entire hospital. Patient seems accepting of this at this time.

## 2020-11-08 NOTE — Progress Notes (Signed)
Patient has refused neuro checks for the rest of the night. Patient stated, "Do not wake me up anymore, this is stupid". I educated the patient on Stroke awareness and she still kept saying this is stupid. I have let the MD know.

## 2020-11-08 NOTE — Evaluation (Signed)
Occupational Therapy Evaluation Patient Details Name: Marie Bell MRN: [id number] DOB: 1955-07-14 Today's Date: 11/08/2020    History of Present Illness Marie Bell is a 65 y.o. female with a history of diabetes, hypertension, hyperlipidemia.  Patient is an occasional smoker.  Patient woke up this morning around 645 to numbness in her right index finger and "something wrong with the right side of her face".  She had difficulty walking first thing in this morning with weakness in her right leg.  As time went on, her leg weakness improved, but she continues to have numbness in the right finger.  There were no palliating or provoking factors.   Clinical Impression   Pt agreeable to OT/PT co-evaluation this date. Pt presents with UE strength and coordination WFL for sequential finger touching and MMT. Despite [facility] performance pt reports a stiffness in R hand that has resulted in knocking over cups and overall decreased coordination. Pt would likely benefit from outpatient OT assessment if symptoms persist. Pt also is noted to have a red rash like area on lateral side of wrist and hand. Pt demonstrated independent bed mobility and functional ambulation/transfers. Pt is not recommended for further OT in the hospital setting and will be discharged to care of nursing staff for the remainder of her stay.     Follow Up Recommendations  Outpatient OT;Other (comment) (Possible out patient OT if reported decreased sensation and corrdination persists. More in depth assessment may be needed.)    Equipment Recommendations  None recommended by OT           Precautions / Restrictions Precautions Precautions: None Restrictions Weight Bearing Restrictions: No      Mobility Bed Mobility Overal bed mobility: Independent                  Transfers Overall transfer level: Independent                    Balance Overall balance assessment: Mild deficits observed, not formally tested                                          ADL either performed or assessed with clinical judgement   ADL Overall ADL's : Independent                                             Vision Baseline Vision/History:  (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                Pertinent Vitals/Pain Pain Assessment: Faces Faces Pain Scale: No hurt Pain Location: not pain but numbness and tingling in R radila side wrist to digits. Pain Descriptors / Indicators: Numbness;Tingling Pain Intervention(s): Monitored during session     Hand Dominance Right   Extremity/Trunk Assessment Upper Extremity Assessment Upper Extremity Assessment: RUE deficits/detail RUE Deficits / Details: Pt demosntrated WFL strength and fine motor coordinaton via sequential finger touching, but pt reported she has difficulty grasping cups and other items because of the tingline and dumbness in her R hand.  Pt able to localize touch well and did not note any decreased sensation with light touch. RUE Sensation: decreased light touch (Not observed via assessments but pt reports it is inhibiting her ability to grasp items without knocking them over. She reported knocking over a cup due to decreased sensation.) RUE Coordination: WNL (WNL per acute testing; more in depth testing via outpatient may be needed due to pt report of difficulty coordinating R hand compared to baseline. Reported a tightness in R hand.)   Lower Extremity Assessment Lower Extremity Assessment: Defer to PT evaluation   Cervical / Trunk Assessment Cervical / Trunk Assessment: Normal   Communication Communication Communication: No difficulties   Cognition Arousal/Alertness:  Awake/alert Behavior During Therapy: WFL for tasks assessed/performed Overall Cognitive Status: Within Functional Limits for tasks assessed                                     General Comments  Red rash like area from lateral wrist to DIP of 2nd digit all on radial side. Very mild swelling possibly.               Home Living Family/patient expects to be discharged to:: Private residence Living Arrangements: Spouse/significant other Available Help at Discharge: Family;Available PRN/intermittently;Available 24 hours/day (Husband available intermittently but pt reported someone could be there 24/7 if needed.) Type of Home: House Home Access: Stairs to enter [employer] of Steps: 2 Entrance Stairs-Rails: None Home Layout: Two level;Able to live on main level with bedroom/bathroom Alternate Level Stairs-Number of Steps: 17 Alternate Level Stairs-Rails: Right (going down) Bathroom Shower/Tub: Chief Strategy Officer: Standard     Home Equipment: Other (comment) ("walking stick")          Prior Functioning/Environment Level of Independence: Independent        Comments: works ; independnet ADL and IADL's                      OT Goals(Current goals can be found in the care plan section) Acute Rehab OT Goals Patient Stated Goal: return home                   Co-evaluation PT/OT/SLP Co-Evaluation/Treatment: Yes Reason for Co-Treatment: To address functional/ADL transfers   OT goals addressed during session: ADL's and self-care;Strengthening/ROM      AM-PAC OT "6 Clicks" Daily Activity     Outcome Measure Help from another person eating meals?: None Help from another person taking care of personal grooming?: None Help from another person toileting, which includes using toliet, bedpan, or urinal?: None Help from another person bathing (including washing, rinsing, drying)?: None Help from another person to put on and taking  off regular upper body clothing?: None Help from another person to put on and taking off regular lower body clothing?: None 6 Click Score: 24   End of Session    Activity Tolerance: Patient tolerated treatment well Patient left: in bed;with call bell/phone within reach;with family/visitor present  OT Visit Diagnosis: Unsteadiness on feet (R26.81);Muscle weakness (generalized) (M62.81);Other symptoms and signs involving the nervous system (O75.643)                Time: 3295-1884 OT Time Calculation (min): 26 min Charges:  OT General Charges $OT Visit: 1 Visit OT Evaluation $OT Eval Low Complexity: 1 Low  Lallie Strahm OT, MOT   Danie Chandler 11/08/2020, 10:08 AM

## 2020-11-08 NOTE — Evaluation (Signed)
Physical Therapy Evaluation Patient Details Name: Marie Bell MRN: [id number] DOB: [birth year] Today's Date: 11/08/2020   History of Present Illness  Marie Bell is a 65 y.o. female with a history of diabetes, hypertension, hyperlipidemia.  Patient is an occasional smoker.  Patient woke up this morning around 645 to numbness in her right index finger and "something wrong with the right side of her face".  She had difficulty walking first thing in this morning with weakness in her right leg.  As time went on, her leg weakness improved, but she continues to have numbness in the right finger.  There were no palliating or provoking factors.    Clinical Impression  Patient functioning near baseline for functional mobility and gait other than c/o of numbness in right hand, otherwise demonstrated good return for functional mobility and ambulation in room, hallways and on stairs.  Plan:  Patient discharged from physical therapy to care of nursing for ambulation daily as tolerated for length of stay.     Follow Up Recommendations No PT follow up    Equipment Recommendations  None recommended by PT    Recommendations for Other Services       Precautions / Restrictions Precautions Precautions: None Restrictions Weight Bearing Restrictions: No      Mobility  Bed Mobility Overal bed mobility: Independent                  Transfers Overall transfer level: Independent                  Ambulation/Gait Ambulation/Gait assistance: Modified independent (Device/Increase time) Gait Distance (Feet): 200 Feet Assistive device: None Gait Pattern/deviations: WFL(Within Functional Limits) Gait velocity: slightly decreased   General Gait Details: grossly WFL, demonstrates good return for ambulation on level, inclined and declined surfaces without loss of balance  Stairs Stairs: Yes Stairs assistance: Modified independent (Device/Increase time) Stair Management: Alternating  pattern;One rail Right;One rail Left Number of Stairs: 10 General stair comments: demonstrates good return for going up/down stairs using 1 siderail without loss of balance  Wheelchair Mobility    Modified Rankin (Stroke Patients Only)       Balance Overall balance assessment: No apparent balance deficits (not formally assessed)                                           Pertinent Vitals/Pain Pain Assessment: Faces Faces Pain Scale: Hurts a little bit Pain Location: chronic right knee pain, numbness/tingling right hand Pain Descriptors / Indicators: Numbness;Tingling;Discomfort Pain Intervention(s): Limited activity within patient's tolerance;Monitored during session    Home Living Family/patient expects to be discharged to:: Private residence Living Arrangements: Spouse/significant other Available Help at Discharge: Family;Available PRN/intermittently;Available 24 hours/day Type of Home: House Home Access: Stairs to enter Entrance Stairs-Rails: None Entrance Stairs-Number of Steps: 2 Home Layout: Two level;Able to live on main level with bedroom/bathroom Home Equipment: [place] - single point      Prior Function Level of Independence: Independent         Comments: works ; independnet ADL and IADL's, Tourist information centre manager, drives     Hand Dominance   Dominant Hand: Right    Extremity/Trunk Assessment   Upper Extremity Assessment Upper Extremity Assessment: Defer to OT evaluation RUE Deficits / Details: Pt demosntrated WFL strength and fine motor coordinaton via sequential finger touching, but pt reported she has difficulty grasping cups and  other items because of the tingline and dumbness in her R hand. Pt able to localize touch well and did not note any decreased sensation with light touch. RUE Sensation: decreased light touch (Not observed via assessments but pt reports it is inhibiting her ability to grasp items without knocking them over. She  reported knocking over a cup due to decreased sensation.) RUE Coordination: WNL (WNL per acute testing; more in depth testing via outpatient may be needed due to pt report of difficulty coordinating R hand compared to baseline. Reported a tightness in R hand.)    Lower Extremity Assessment Lower Extremity Assessment: Overall WFL for tasks assessed    Cervical / Trunk Assessment Cervical / Trunk Assessment: Normal  Communication   Communication: No difficulties  Cognition Arousal/Alertness: Awake/alert Behavior During Therapy: WFL for tasks assessed/performed Overall Cognitive Status: Within Functional Limits for tasks assessed                                        General Comments General comments (skin integrity, edema, etc.): Red rash like area from lateral wrist to DIP of 2nd digit all on radial side. Very mild swelling possibly.    Exercises     Assessment/Plan    PT Assessment Patent does not need any further PT services  PT Problem List         PT Treatment Interventions      PT Goals (Current goals can be found in the Care Plan section)  Acute Rehab PT Goals Patient Stated Goal: return home with family to assist PT Goal Formulation: With patient/family Time For Goal Achievement: 11/08/20 Potential to Achieve Goals: Good    Frequency     Barriers to discharge        Co-evaluation PT/OT/SLP Co-Evaluation/Treatment: Yes Reason for Co-Treatment: To address functional/ADL transfers PT goals addressed during session: Mobility/safety with mobility;Balance OT goals addressed during session: ADL's and self-care;Strengthening/ROM       AM-PAC PT "6 Clicks" Mobility  Outcome Measure Help needed turning from your back to your side while in a flat bed without using bedrails?: None Help needed moving from lying on your back to sitting on the side of a flat bed without using bedrails?: None Help needed moving to and from a bed to a chair (including a  wheelchair)?: None Help needed standing up from a chair using your arms (e.g., wheelchair or bedside chair)?: None Help needed to walk in hospital room?: None Help needed climbing 3-5 steps with a railing? : None 6 Click Score: 24    End of Session   Activity Tolerance: Patient tolerated treatment well Patient left: in bed;with call bell/phone within reach;with family/visitor present Nurse Communication: Mobility status PT Visit Diagnosis: Unsteadiness on feet (R26.81);Other abnormalities of gait and mobility (R26.89);Muscle weakness (generalized) (M62.81)    Time: 4403-4742 PT Time Calculation (min) (ACUTE ONLY): 20 min   Charges:   PT Evaluation $PT Eval Moderate Complexity: 1 Mod PT Treatments $Therapeutic Activity: 8-22 mins        11:13 AM, 11/08/20 Ocie Bob, MPT Physical Therapist with [facility] [phone] office [phone] mobile phone

## 2020-12-14 ENCOUNTER — Other Ambulatory Visit: Payer: Self-pay

## 2020-12-14 ENCOUNTER — Ambulatory Visit (HOSPITAL_COMMUNITY): Payer: PRIVATE HEALTH INSURANCE | Attending: Neurology

## 2020-12-14 ENCOUNTER — Encounter (HOSPITAL_COMMUNITY): Payer: Self-pay

## 2020-12-14 DIAGNOSIS — R278 Other lack of coordination: Secondary | ICD-10-CM | POA: Diagnosis present

## 2020-12-14 DIAGNOSIS — I69918 Other symptoms and signs involving cognitive functions following unspecified cerebrovascular disease: Secondary | ICD-10-CM | POA: Diagnosis present

## 2020-12-14 NOTE — Therapy (Signed)
[facility] [address] [place], [place], [address] Phone: [phone]   Fax:  [phone]  Occupational Therapy Evaluation  Patient Details  Name: Marie Bell MRN: [id number] Date of Birth: 1956/04/17 Referring Provider (OT): [name] PA-C   Encounter Date: 12/14/2020   OT End of Session - 12/14/20 1455     Visit Number 1    Number of Visits 1    Authorization Type Generic Commercial    Authorization Time Period 11/24/20-11/24/21 no visit limit 25% co-insurance. pt to pay $25 towards    OT Start Time 1030    OT Stop Time 1123    OT Time Calculation (min) 53 min    Activity Tolerance Patient tolerated treatment well    Behavior During Therapy WFL for tasks assessed/performed             Past Medical History:  Diagnosis Date   DM (diabetes mellitus) (HCC)    Hyperlipidemia    Hypertension     Past Surgical History:  Procedure Laterality Date   CHOLECYSTECTOMY     TOTAL VAGINAL HYSTERECTOMY      There were no vitals filed for this visit.   Subjective Assessment - 12/14/20 1039     Subjective  S: I don't have any pain. I do have some numbness in my hand. It's gotten better though. I also feel it along my right side.    Pertinent History Patient is a 65 y/o female S/P Left CVA which occurred on 11/06/20 and since then has experienced a change in sensation on her right side and decreased motor control and strength. [name] PA-C has referred patient to occupational therapy for evaluation and treatment.    Patient Stated Goals To increase her sensation and motor control.    Currently in Pain? Yes   No pain but numbness   Pain Score 8     Pain Location Hand    Pain Orientation Right    Pain Descriptors / Indicators Numbness;Tightness    Pain Type Acute pain    Pain Onset More than a month ago    Pain Frequency Constant    Aggravating Factors  In the morning it is the worse then it calms down a little bit.    Pain  Relieving Factors None    Effect of Pain on Daily Activities Max effect               [facility] OT Assessment - 12/14/20 1041       Assessment   Medical Diagnosis Right arm weakness/decreased sensation    Referring Provider (OT) [name] PA-C    Onset Date/Surgical Date 11/06/20    Hand Dominance Right    Next MD Visit N/A    Prior Therapy Pt received OT and PT evaluations at [facility] acute.      Precautions   Precautions None      Restrictions   Weight Bearing Restrictions No      Balance Screen   Has the patient fallen in the past 6 months No      Home  Environment   Family/patient expects to be discharged to: Private residence    Living Arrangements Spouse/significant other      Prior Function   Level of Independence Independent    Vocation Self employed;Part time employment    Vocation Requirements [employer]      ADL   ADL comments Difficulty with gripping and holding onto items, picking up  a coffee and stabilizing it while bringing to her mouth to drink, decreased endurance.      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 L eye retina tear that required lazer surgery in the past      Cognition   Overall Cognitive Status Within Functional Limits for tasks assessed      Observation/Other Assessments   Focus on Therapeutic Outcomes (FOTO)  N/A      Sensation   Light Touch Impaired Detail    Light Touch Impaired Details Impaired RUE    Semmes Weinstein Monofilament Scale Diminished Light Touch    Stereognosis Appears Intact    Hot/Cold Appears Intact    Proprioception Impaired by gross assessment    Proprioception Impaired Details Impaired RUE    Additional Comments Patient was able to detect normal filament in portions of her right hand palm while mixed  with light touch diminished filament. No pattern noted with decreased sensation areas.      Coordination   Gross Motor Movements are Fluid and Coordinated Yes    Fine Motor Movements are Fluid and Coordinated Yes    9 Hole Peg Test Right;Left    Right 9 Hole Peg Test 19.6"    Left 9 Hole Peg Test 23.2"      Perception   Perception Within Functional Limits      Praxis   Praxis Intact      ROM / Strength   AROM / PROM / Strength Strength;AROM      AROM   Overall AROM  Within functional limits for tasks performed    Overall AROM Comments BUE A/ROM shoulder, elbow, wrist, and hand in all ranges.      Strength   Overall Strength Comments Assessed seated. IR/er adducted    Strength Assessment Site Hand;Shoulder    Right/Left Shoulder Right;Left    Right Shoulder Flexion 4+/5    Right Shoulder ABduction 4+/5    Right Shoulder Internal Rotation 5/5    Right Shoulder External Rotation 5/5    Left Shoulder Flexion 5/5    Left Shoulder ABduction 5/5    Left Shoulder Internal Rotation 5/5    Left Shoulder External Rotation 5/5    Right/Left hand Right;Left    Right Hand Grip (lbs) 55    Right Hand Lateral Pinch 16 lbs    Right Hand 3 Point Pinch 18 lbs    Left Hand Grip (lbs) 70    Left Hand Lateral Pinch 16 lbs    Left Hand 3 Point Pinch 18 lbs                             OT Education - 12/14/20 1453     Education Details Reviewed evaluation findings. Discussed sensation changes after a CVA and prognosis. Provided HEP including sensory re-training, red putty for hand strength, scapular/shoulder stability exercises with red band loop    Person(s) Educated Patient    Methods Explanation;Handout;Verbal cues    Comprehension Verbalized understanding;Returned demonstration              OT Short Term Goals - 12/14/20 1505       OT SHORT TERM GOAL #1   Title Patient  will be educated and verbalize understanding of HEP in order to focus on sensory  re-training and increase stability and motor control of her RUE while completing required daily, leisure, and work related tasks.    Time 1    Period Days    Status Achieved    Target Date 12/14/20                      Plan - 12/14/20 1457     Clinical Impression Statement A: patient is a 65 y/o female S/P left CVA with right side weakness and sensation changes. Patient presents with mild strength deficits involving grip strength and shoulder stability/motor control. Sensation deficits are improving per patient report. HEP was established and reviewed. All education and questions were completed during evaluation. No follow up OT services were recommended at this time. Patient is in agreement with recommendation.    OT Occupational Profile and History Problem Focused Assessment - Including review of records relating to presenting problem    Occupational performance deficits (Please refer to evaluation for details): ADL's    Rehab Potential Excellent    Clinical Decision Making Limited treatment options, no task modification necessary    Comorbidities Affecting Occupational Performance: Presence of comorbidities impacting occupational performance    Comorbidities impacting occupational performance description: bilateral CMC thumb arthritis    Modification or Assistance to Complete Evaluation  No modification of tasks or assist necessary to complete eval    OT Frequency One time visit    OT Treatment/Interventions Patient/family education    Plan P: One time visit with HEP established.    OT Home Exercise Plan eval: see education section    Consulted and Agree with Plan of Care Patient             Patient will benefit from skilled therapeutic intervention in order to improve the following deficits and impairments:           Visit Diagnosis: Other lack of coordination - Plan: Ot plan of care cert/re-cert  Other symptoms and signs involving cognitive functions following  unspecified cerebrovascular disease - Plan: Ot plan of care cert/re-cert    Problem List Patient Active Problem List   Diagnosis Date Noted   Tobacco abuse 11/08/2020   Dyslipidemia with low high density lipoprotein (HDL) cholesterol with hypertriglyceridemia due to type 2 diabetes mellitus (HCC) 11/08/2020   Acute stroke due to ischemia/ Acute left thalamocapsular infarct. 11/07/2020   Type 2 diabetes mellitus with hyperglycemia, without long-term current use of insulin (HCC) 10/12/2019   Hypertriglyceridemia 10/12/2019   Overweight 10/15/2016   BMI 29.0-29.9,adult 10/15/2016   Hyperlipemia, mixed 10/15/2016   High triglycerides 10/15/2016   TRIGGER FINGER 10/08/2007   Essential hypertension 10/07/2007    Limmie Patricia, OTR/L,CBIS  [phone]   12/14/2020, 3:53 PM  [facility] [address] [place], [place], [address] Phone: [phone]   Fax:  [phone]  Name: MAHRUKH SEGUIN MRN: [id number] Date of Birth: 1955-08-31

## 2020-12-14 NOTE — Patient Instructions (Signed)
Spend no more than 30 minutes at a time to complete. 1-2 times a day.       Both arms straight out in front of you, keep your elbows straight do not let them bend, the criss/cross your arms in front of you. 10 times.   Both arms straight out in front of you, keep elbows straight.  Bend your wrist back and make small circles in the air.  Go in one direction the go in the other (Like you are waxing something) 10 times.   Both arms straight out in front of you, keep elbows straight.  Bend your wrists back and move your arm up while the left arm goes down as quick as you can. 10 times.   With your right/left arm straight out in front of you, keep your elbow straight.  Write your name in the air 3-4 times quickly.  Theraputty Home Exercise Program  Complete 1-2 times a day.  putty squeeze  Pt. should squeeze putty in hand trying to keep it round by rotating putty after each squeeze. push fingers through putty to palm each time. Complete for __3-5____ minutes.   Complete ___10-15____ repetitions each. Complete ____1-2___ time a day.  Wall taps with theraband  With a looped elastic band around forearms/wrists and arms at a 90 angle pressed against the wall. Keeping elbows on the wall, tap right arm out to the right. Hold for 1 second. Return right arm back to neutral. Repeat with left arm.    Wall V slides with Theraband  Place a band loop around hands/forearms and face a wall. Extend both arms diagonally into a V shape on the wall. Hold this stretch for specified amount of time. Lower arms slowly back into neutral position. Repeat.       scap clocks  Tie a loop with a theraband and place around your wrists.  Stand with a wall in front of you.  Picture a clock in front of you.  Place both palms on the wall, arms straight.  While keeping the left/right hand planted, use the right/left hand to pull away and tap each number (1, 3, 5- right OR 11,9,7 left), coming back to center each  time.

## 2021-01-13 ENCOUNTER — Other Ambulatory Visit: Payer: Self-pay | Admitting: Internal Medicine

## 2021-05-04 ENCOUNTER — Ambulatory Visit: Payer: PRIVATE HEALTH INSURANCE | Admitting: Internal Medicine

## 2022-03-15 ENCOUNTER — Telehealth (HOSPITAL_COMMUNITY): Payer: Self-pay

## 2022-03-15 NOTE — Telephone Encounter (Signed)
Called to schedule consult, no answer, left vm. AW  

## 2022-07-12 IMAGING — MR MR HEAD W/O CM
11 of 13 series · 26 of 48 positions shown · non-contrast
Comparison: No pertinent prior exam.
COMPARISON: No pertinent prior exam.

CLINICAL DATA: Neuro deficit, acute stroke suspected.

EXAM:
MRI HEAD WITHOUT CONTRAST
MRA HEAD WITHOUT CONTRAST
TECHNIQUE: Multiplanar, multi-echo pulse sequences of the brain and surrounding
structures were acquired without intravenous contrast. Angiographic
images of the Circle of Willis were acquired using MRA technique
without intravenous contrast.

[Series 5: DWI · axial · 4.0mm · 0.88mm/px · z∈[-26,+113]mm · 3 of 36 slices shown (1 of 6)]
[im 1/36]
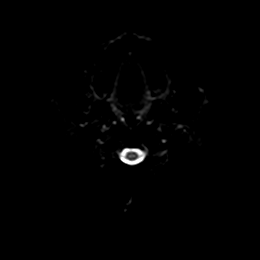
[im 18/36]
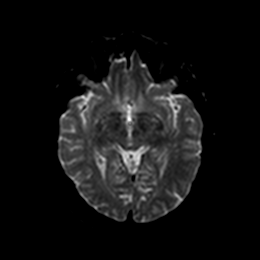
[im 36/36]
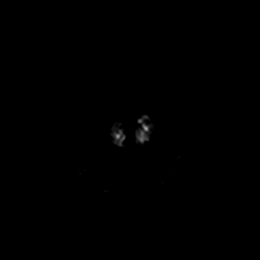

[Series 5: DWI · axial · 4.0mm · 0.88mm/px · z∈[-26,+113]mm · 3 of 36 slices shown (2 of 6)]
[im 1/36]
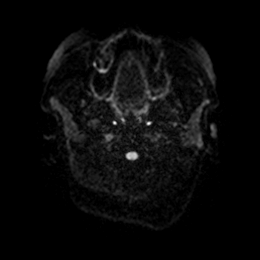
[im 18/36]
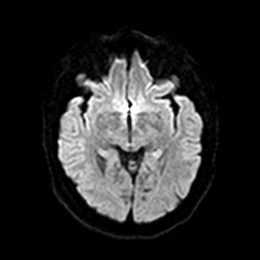
[im 36/36]
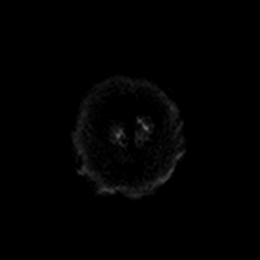

[Series 6: DWI · axial · 4.0mm · 0.88mm/px · z∈[-26,+113]mm · 3 of 36 slices shown (3 of 6)]
[im 1/36]
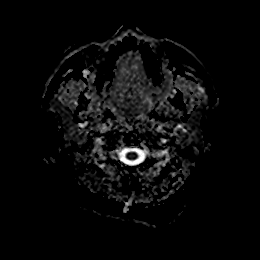
[im 18/36]
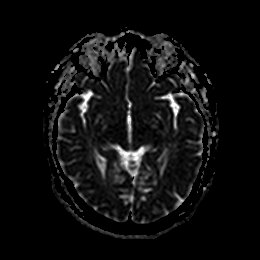
[im 36/36]
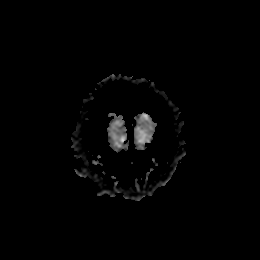

[Series 7: DWI · coronal · 5.0mm · 0.88mm/px · 2 of 26 slices shown (4 of 6)]
[im 1/26]
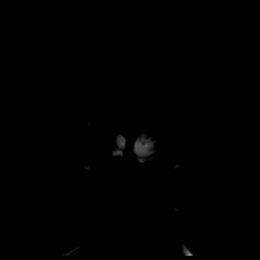
[im 26/26]
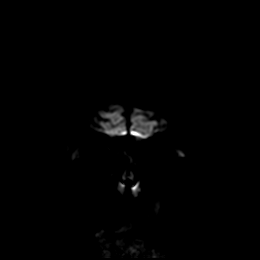

[Series 7: DWI · coronal · 5.0mm · 0.88mm/px · 2 of 26 slices shown (5 of 6)]
[im 1/26]
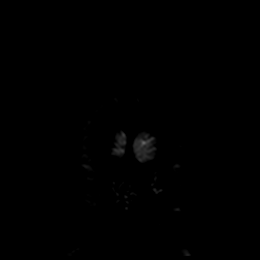
[im 26/26]
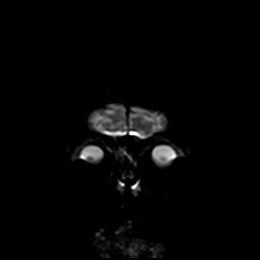

[Series 8: DWI · coronal · 5.0mm · 0.88mm/px · 2 of 26 slices shown (6 of 6)]
[im 1/26]
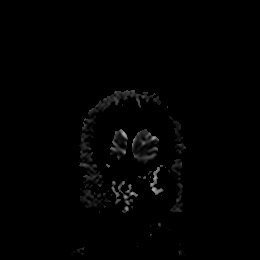
[im 26/26]
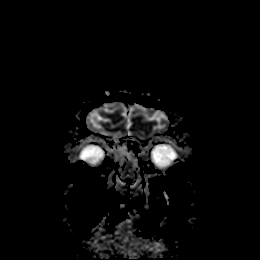

[Series 9: T1 · sagittal · 5.0mm · 0.94mm/px · 2 of 20 slices shown]
[im 1/20]
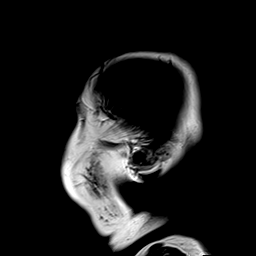
[im 20/20]
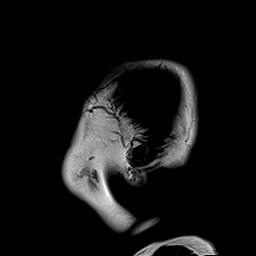

[Series 10: T2 · axial · 5.0mm · 0.72mm/px · z∈[-22,+109]mm · 2 of 20 slices shown (1 of 2)]
[im 1/20]
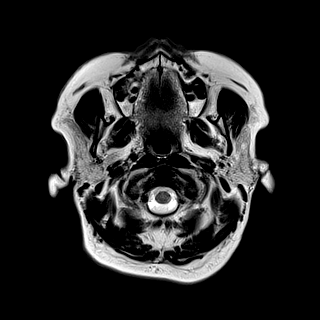
[im 20/20]
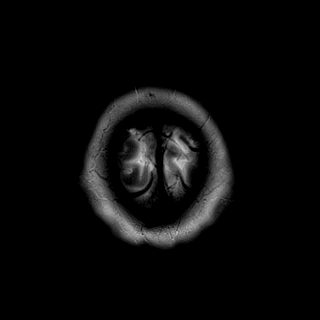

[Series 11: ax hemo · axial · 5.0mm · 0.86mm/px · z∈[-28,+114]mm · 2 of 25 slices shown]
[im 1/25]
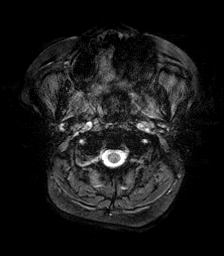
[im 25/25]
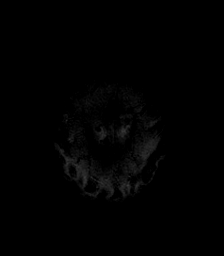

[Series 17: FLAIR · axial · 4.0mm · 0.43mm/px · z∈[-18,+104]mm · 3 of 32 slices shown]
[im 1/32]
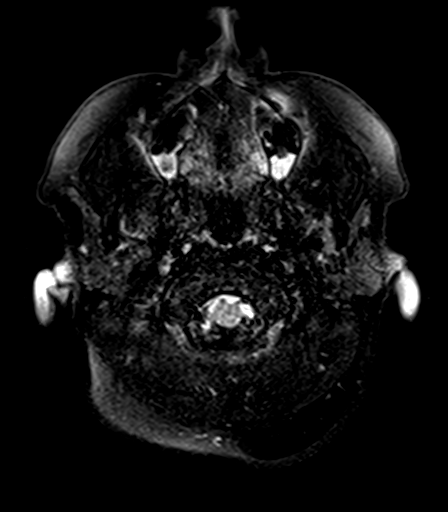
[im 16/32]
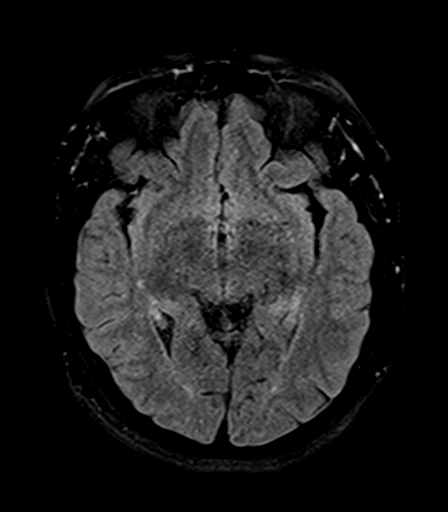
[im 32/32]
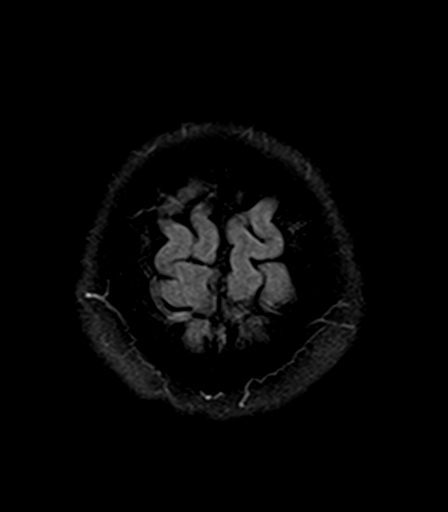

[Series 19: T2 · coronal · 5.0mm · 0.72mm/px · 2 of 24 slices shown (2 of 2)]
[im 1/24]
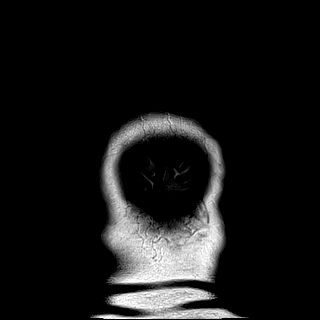
[im 24/24]
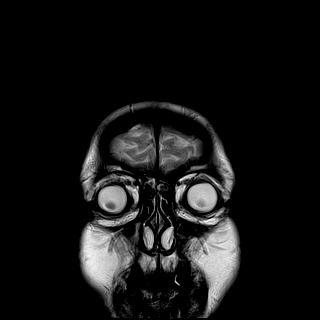

[26 of 48 positions shown; findings below may reference images not displayed]

FINDINGS: MRI HEAD FINDINGS

Brain: Acute left thalamocapsular infarct. Mild associated edema
without mass effect. No hydrocephalus. No acute hemorrhage. No
extra-axial fluid collection. No midline shift. Age advanced patchy
T2/FLAIR hyperintensities within the white matter.

Vascular: Evaluated below.

Skull and upper cervical spine: Normal marrow signal.

Sinuses/Orbits: Moderate mucosal thickening of scattered ethmoid air
cells. Mild mucosal thickening of the maxillary sinuses. Left
greater than right globe staphylomas. Otherwise, unremarkable
orbits.

Other: No sizable mastoid effusion.

MRA HEAD FINDINGS

Anterior circulation: No large vessel occlusion. Moderate stenosis
of the left internal carotid artery immediately proximal to the
cavernous segment. Severe stenosis of the distal right A1 ACA.
Otherwise, no proximal hemodynamically significant stenosis.
Approximately 2 mm outpouching arising from the left supraclinoid
ICA in the expected region of the posterior communicating artery
without vessel arising from the tip, compatible with aneurysm versus
with vessel too small to see by MRA (see series 12, images

Posterior circulation: Right dominant intradural vertebral artery.
Patent basilar artery.

Approximately 2 mm conical outpouching of the left basilar artery
with the anterior inferior cerebellar artery (AICA) arising from the
tip, compatible with infundibulum. Bilateral posterior cerebral
arteries are patent with mild narrowing of the proximal right P2
PCA. Limited evaluation of the distal PCAs by MRA technique.
IMPRESSION: MRI head:

1. Acute left thalamocapsular infarct. Mild associated edema without
mass effect.
2. Age advanced patchy T2/FLAIR hyperintensities within the white
matter, nonspecific but most likely related to chronic microvascular
ischemic disease.

MRA head:

1. Severe stenosis of the distal right A1 ACA.
2. Moderate stenosis of the left internal carotid artery immediately
proximal to the cavernous segment.
3. Mild right proximal P2 PCA stenosis.
4. Approximately 2 mm left posterior communicating artery aneurysm
versus infundibulum with vessel too small to see by MRA.
5. Approximately 2 mm left AICA infundubulum.

## 2022-07-17 ENCOUNTER — Telehealth: Payer: Self-pay | Admitting: "Endocrinology

## 2022-07-17 NOTE — Telephone Encounter (Signed)
Received a referral on pt for Diabetes. I can not make out the lab. I left a Vm at [facility] requesting a better copy

## 2022-07-18 LAB — BASIC METABOLIC PANEL
BUN: 10 (ref 4–21)
Creatinine: 0.7 (ref 0.5–1.1)

## 2022-07-18 LAB — LIPID PANEL
Cholesterol: 175 (ref 0–200)
HDL: 40 (ref 35–70)
LDL Cholesterol: 102
Triglycerides: 189 — AB (ref 40–160)

## 2022-07-18 LAB — LAB REPORT - SCANNED: EGFR: 95

## 2022-07-18 LAB — HEMOGLOBIN A1C: Hemoglobin A1C: 8.9

## 2022-07-18 LAB — TSH: TSH: 1.64 (ref 0.41–5.90)

## 2022-08-17 ENCOUNTER — Encounter: Payer: Self-pay | Admitting: "Endocrinology

## 2022-08-28 ENCOUNTER — Ambulatory Visit: Payer: PRIVATE HEALTH INSURANCE | Admitting: "Endocrinology

## 2022-08-28 ENCOUNTER — Encounter: Payer: Self-pay | Admitting: "Endocrinology

## 2022-08-28 VITALS — BP 138/80 | HR 80 | Ht 67.0 in | Wt 173.8 lb

## 2022-08-28 DIAGNOSIS — I10 Essential (primary) hypertension: Secondary | ICD-10-CM

## 2022-08-28 DIAGNOSIS — E782 Mixed hyperlipidemia: Secondary | ICD-10-CM | POA: Diagnosis not present

## 2022-08-28 DIAGNOSIS — E1165 Type 2 diabetes mellitus with hyperglycemia: Secondary | ICD-10-CM

## 2022-08-28 MED ORDER — METFORMIN HCL 1000 MG PO TABS: 1000.0000 mg | ORAL_TABLET | Freq: Two times a day (BID) | ORAL | 3 refills | Status: DC

## 2022-08-28 MED ORDER — GLIPIZIDE ER 2.5 MG PO TB24: 2.5000 mg | ORAL_TABLET | Freq: Every day | ORAL | 1 refills | Status: DC

## 2022-08-28 MED ORDER — ATORVASTATIN CALCIUM 40 MG PO TABS: 40.0000 mg | ORAL_TABLET | Freq: Every day | ORAL | 1 refills | Status: DC

## 2022-08-28 MED ORDER — BISOPROLOL-HYDROCHLOROTHIAZIDE 2.5-6.25 MG PO TABS: 1.0000 | ORAL_TABLET | Freq: Every day | ORAL | 1 refills | Status: AC

## 2022-08-28 NOTE — Patient Instructions (Signed)

## 2022-08-28 NOTE — Progress Notes (Signed)
Endocrinology Consult Note       08/28/2022, 12:19 PM   Subjective:    Patient ID: Marie Bell, female    DOB: Oct 05, 1955.  Marie Bell is being seen in consultation for management of currently uncontrolled symptomatic diabetes requested by  Sharilyn Sites, MD.   Past Medical History:  Diagnosis Date   DM (diabetes mellitus) ([place])    Hyperlipidemia    Hypertension     Past Surgical History:  Procedure Laterality Date   CHOLECYSTECTOMY     TOTAL VAGINAL HYSTERECTOMY      Social History   Socioeconomic History   Marital status: Married    Spouse name: Not on file   Number of children: 2   Years of education: Not on file   Highest education level: Not on file  Occupational History   Occupation: HR  Tobacco Use   Smoking status: Every Day    Types: E-cigarettes    Last attempt to quit: 06/18/2006    Years since quitting: 16.2   Smokeless tobacco: Never  Vaping Use   Vaping Use: Former  Substance and Sexual Activity   Alcohol use: No   Drug use: No   Sexual activity: Not on file  Other Topics Concern   Not on file  Social History Narrative   Not on file   Social Determinants of Health   Financial Resource Strain: Not on file  Food Insecurity: Not on file  Transportation Needs: Not on file  Physical Activity: Not on file  Stress: Not on file  Social Connections: Not on file    Family History  Problem Relation Age of Onset   Hypertension Mother    Arthritis Mother    Liver disease Father    Alcoholism Father    Diabetes Maternal Grandmother    Colon cancer Maternal Grandfather     Outpatient Encounter Medications as of 08/28/2022  Medication Sig   amLODipine (NORVASC) 5 MG tablet Take 5 mg by mouth daily.   glipiZIDE (GLUCOTROL XL) 2.5 MG 24 hr tablet Take 1 tablet (2.5 mg total) by mouth daily with breakfast.   OVER THE COUNTER MEDICATION CBD gummie daily    [DISCONTINUED] glimepiride (AMARYL) 2 MG tablet Take 2 mg by mouth 2 (two) times daily.   atorvastatin (LIPITOR) 40 MG tablet Take 1 tablet (40 mg total) by mouth daily.   bisoprolol-hydrochlorothiazide (ZIAC) 2.5-6.25 MG tablet Take 1 tablet by mouth daily.   fenofibrate 160 MG tablet Take 1 tablet (160 mg total) by mouth daily.   losartan (COZAAR) 50 MG tablet Take 100 mg by mouth daily.   metFORMIN (GLUCOPHAGE) 1000 MG tablet Take 1 tablet (1,000 mg total) by mouth 2 (two) times daily with a meal.   [DISCONTINUED] atorvastatin (LIPITOR) 40 MG tablet Take 1 tablet (40 mg total) by mouth daily.   [DISCONTINUED] bisoprolol-hydrochlorothiazide (ZIAC) 2.5-6.25 MG tablet Take 1 tablet by mouth daily.   [DISCONTINUED] glipiZIDE (GLUCOTROL) 5 MG tablet TAKE 1 TABLET BY MOUTH BEFORE BREAKFAST AND 1.5 TABLETS BEFORE SUPPER   [DISCONTINUED] glipiZIDE (GLUCOTROL) 5 MG tablet TAKE 1 TABLET BY MOUTH BEFORE BREAKFAST AND 1.5 TABLETS  BEFORE SUPPER   [DISCONTINUED] metFORMIN (GLUCOPHAGE) 1000 MG tablet Take 1 tablet (1,000 mg total) by mouth 2 (two) times daily with a meal.   No facility-administered encounter medications on file as of 08/28/2022.    ALLERGIES: Allergies  Allergen Reactions   Simvastatin Other (See Comments)    Leg cramps   Penicillins    Augmentin [Amoxicillin-Pot Clavulanate] Rash   Cyclobenzaprine Palpitations    Body aches,nausea,weakness   Sulfa Antibiotics Rash    VACCINATION STATUS:  There is no immunization history on file for this patient.  Diabetes She presents for her initial diabetic visit. She has type 2 diabetes mellitus. Onset time: She was diagnosed at approximate age of 67 years. Her disease course has been worsening. There are no hypoglycemic associated symptoms. Pertinent negatives for hypoglycemia include no confusion, headaches, pallor or seizures. Associated symptoms include fatigue, polydipsia and polyuria. Pertinent negatives for diabetes include no chest  pain and no polyphagia. There are no hypoglycemic complications. Symptoms are worsening. Diabetic complications include a CVA. Risk factors for coronary artery disease include dyslipidemia, hypertension, sedentary lifestyle and tobacco exposure. Current diabetic treatments: She is currently taking metformin 1000 mg p.o. twice daily, glimepiride 2 mg twice daily. Her weight is fluctuating minimally. She is following a generally unhealthy diet. When asked about meal planning, she reported none. She rarely participates in exercise. (She did not bring her meter to review.  Her recent A1c was 8.9%.) An ACE inhibitor/angiotensin II receptor blocker is being taken.  Hyperlipidemia This is a chronic problem. The problem is uncontrolled. Exacerbating diseases include diabetes. Pertinent negatives include no chest pain, myalgias or shortness of breath. Current antihyperlipidemic treatment includes statins and fibric acid derivatives. Risk factors for coronary artery disease include diabetes mellitus, dyslipidemia, family history, hypertension, a sedentary lifestyle and post-menopausal.  Hypertension This is a chronic problem. Pertinent negatives include no chest pain, headaches, palpitations or shortness of breath. Risk factors for coronary artery disease include dyslipidemia, diabetes mellitus, sedentary lifestyle, smoking/tobacco exposure and post-menopausal state. Past treatments include angiotensin blockers, beta blockers and calcium channel blockers. Hypertensive end-organ damage includes CVA.     Review of Systems  Constitutional:  Positive for fatigue. Negative for chills, fever and unexpected weight change.  HENT:  Negative for trouble swallowing and voice change.   Eyes:  Negative for visual disturbance.  Respiratory:  Negative for cough, shortness of breath and wheezing.   Cardiovascular:  Negative for chest pain, palpitations and leg swelling.  Gastrointestinal:  Negative for diarrhea, nausea and  vomiting.  Endocrine: Positive for polydipsia and polyuria. Negative for cold intolerance, heat intolerance and polyphagia.  Musculoskeletal:  Negative for arthralgias and myalgias.  Skin:  Negative for color change, pallor, rash and wound.  Neurological:  Negative for seizures and headaches.  Psychiatric/Behavioral:  Negative for confusion and suicidal ideas.     Objective:       08/28/2022    9:27 AM 11/08/2020    6:10 AM 11/08/2020    1:24 AM  Vitals with BMI  Height '5\' 7"'$     Weight 173 lbs 13 oz    BMI [id number]    Systolic 0000000 [id number]  Diastolic 80 85 71  Pulse 80 69 70    BP 138/80   Pulse 80   Ht '5\' 7"'$  (1.702 m)   Wt 173 lb 12.8 oz (78.8 kg)   BMI 27.22 kg/m   Wt Readings from Last 3 Encounters:  08/28/22 173 lb 12.8 oz (78.8 kg)  11/07/20 182  lb 15.7 oz (83 kg)  11/03/20 183 lb 4 oz (83.1 kg)     Physical Exam Constitutional:      Appearance: She is well-developed.  HENT:     Head: Normocephalic and atraumatic.  Neck:     Thyroid: No thyromegaly.     Trachea: No tracheal deviation.  Cardiovascular:     Rate and Rhythm: Normal rate and regular rhythm.  Pulmonary:     Effort: Pulmonary effort is normal.     Breath sounds: Normal breath sounds.  Abdominal:     Tenderness: There is no abdominal tenderness. There is no guarding.  Musculoskeletal:        General: Normal range of motion.     Cervical back: Normal range of motion and neck supple.  Skin:    General: Skin is warm and dry.     Coloration: Skin is not pale.     Findings: No erythema or rash.  Neurological:     Mental Status: She is alert and oriented to person, place, and time.     Cranial Nerves: No cranial nerve deficit.     Coordination: Coordination normal.     Deep Tendon Reflexes: Reflexes are normal and symmetric.  Psychiatric:        Judgment: Judgment normal.     Comments: Reluctant affect.       CMP ( most recent) CMP     Component Value Date/Time   NA 135 11/07/2020 1016   K  3.7 11/07/2020 1016   CL 99 11/07/2020 1016   CO2 26 11/07/2020 1016   GLUCOSE 139 (H) 11/07/2020 1016   BUN 8 11/07/2020 1016   CREATININE 0.60 11/07/2020 1016   CALCIUM 9.2 11/07/2020 1016   PROT 6.8 11/07/2020 1016   ALBUMIN 4.0 11/07/2020 1016   AST 13 (L) 11/07/2020 1016   ALT 19 11/07/2020 1016   ALKPHOS 54 11/07/2020 1016   BILITOT 0.4 11/07/2020 1016   GFRNONAA >60 11/07/2020 1016     Diabetic Labs (most recent): Lab Results  Component Value Date   HGBA1C 7.1 (H) 11/08/2020   HGBA1C 7.1 (A) 11/03/2020   HGBA1C 7.4 (A) 04/11/2020     Lipid Panel ( most recent) Lipid Panel     Component Value Date/Time   CHOL 225 (H) 11/08/2020 0518   CHOL 197 01/21/2017 0814   TRIG 716 (H) 11/08/2020 0518   HDL 35 (L) 11/08/2020 0518   HDL 49 01/21/2017 0814   CHOLHDL 6.4 11/08/2020 0518   VLDL UNABLE TO CALCULATE IF TRIGLYCERIDE OVER 400 mg/dL 11/08/2020 0518   LDLCALC UNABLE TO CALCULATE IF TRIGLYCERIDE OVER 400 mg/dL 11/08/2020 0518   LDLCALC 121 (H) 01/21/2017 0814   LDLDIRECT 82.7 11/08/2020 0518   LABVLDL 27 01/21/2017 0814      Lab Results  Component Value Date   TSH 3.150 01/21/2017           Assessment & Plan:   1. Type 2 diabetes mellitus with hyperglycemia, without long-term current use of insulin ([facility])   - RUKAYA BOOZ has currently uncontrolled symptomatic type 2 DM since  67 years of age,  with most recent A1c of 8.9 %. Recent labs reviewed. - I had a long discussion with her about the possible risk factors and  the pathology behind its diabetes and its complications. -her diabetes is complicated by CVA, comorbid hyperlipidemia and hypertension, history of smoking and she remains at a high risk for more acute and chronic complications which include CAD,  CVA, CKD, retinopathy, and neuropathy. These are all discussed in detail with her.  - I discussed all available options of managing her diabetes including de-escalation of medications. I have  counseled her on Food as Medicine by adopting a Whole Food , Plant Predominant  ( WFPP) nutrition as recommended by [employer] of Lifestyle Medicine. Patient is encouraged to switch to  unprocessed or minimally processed  complex starch, adequate protein intake (mainly plant source), minimal liquid fat, plenty of fruits, and vegetables. -  she is advised to stick to a routine mealtimes to eat 3 complete meals a day and snack only when necessary ( to snack only to correct hypoglycemia BG <70 day time or <100 at night).   - she acknowledges that there is a room for improvement in her food and drink choices. - Further Specific Suggestion is made for her to avoid simple carbohydrates  from her diet including Cakes, Sweet Desserts, Ice Cream, Soda (diet and regular), Sweet Tea, Candies, Chips, Cookies, Store Bought Juices, Alcohol ,  Artificial Sweeteners,  Coffee Creamer, and "Sugar-free" Products. This will help patient to have more stable blood glucose profile and potentially avoid unintended weight gain.  The following Lifestyle Medicine recommendations according to [facility]) were discussed and offered to patient and she agrees to start the journey:  A.  Whole Foods, Plant-based plate comprising of fruits and vegetables, plant-based proteins, whole-grain carbohydrates was discussed in detail with the patient.   A list for source of those nutrients were also provided to the patient.  Patient will use only water or unsweetened tea for hydration. B.  The need to stay away from risky substances including alcohol, smoking; obtaining 7 to 9 hours of restorative sleep, at least 150 minutes of moderate intensity exercise weekly, the importance of healthy social connections,  and stress reduction techniques were discussed. C.  A full color page of  Calorie density of various food groups per pound showing examples of each food groups was provided to the patient.  - she will  be scheduled with Jearld Fenton, RDN, CDE for individualized diabetes education.  - I have approached her with the following individualized plan to manage  her diabetes and patient agrees:   -She wishes to avoid further escalation in her medications. -Accordingly, she is advised to engage with lifestyle medicine and start monitoring blood glucose twice a day-daily before breakfast and at bedtime until next visit in 2 months.  -She is advised to continue metformin 1000 mg p.o. twice daily, advised to discontinue glimepiride.  I discussed and added glipizide 2.5 mg XL p.o. daily at breakfast.  Document self allergy, however she has used glimepiride for several years without any problem.   -If she returns with A1c greater than 9%, she will be approached for SGLT2 inhibitors, basal insulin treatment or weekly GLP-1 receptor agonists.  - Specific targets for  A1c;  LDL, HDL,  and Triglycerides were discussed with the patient.  2) Blood Pressure /Hypertension:  her blood pressure is  controlled to target.   she is advised to continue her current medications including amlodipine 5 mg p.o. daily, bisoprolol-hydrochlorothiazide 2.5-6.25 mg p.o. twice daily, losartan 50 mg p.o. daily.    3) Lipids/Hyperlipidemia:   Review of her recent lipid panel showed uncontrolled  LDL, and triglycerides above 700.   Her recent LDL was 102. This is consistent with mixed hyperlipidemia.  She is advised to continue atorvastatin 40 mg p.o. nightly, fenofibrate 160 mg p.o.  daily.  Whole food plant-based diet discussed above will address hyperlipidemia as well.  4)  Weight/Diet:  Body mass index is 27.22 kg/m.  -     she is  a candidate for weight loss. I discussed with her the fact that loss of 5 - 10% of her  current body weight will have the most impact on her diabetes management.  The above detailed  ACLM recommendations for nutrition, exercise, sleep, social life, avoidance of risky substances, the need for restorative  sleep   information will also detailed on discharge instructions.  5) Chronic Care/Health Maintenance:  -she  is on ACEI/ARB and Statin medications and  is encouraged to initiate and continue to follow up with Ophthalmology, Dentist,  Podiatrist at least yearly or according to recommendations, and advised to   stay away from smoking. I have recommended yearly flu vaccine and pneumonia vaccine at least every 5 years; moderate intensity exercise for up to 150 minutes weekly; and  sleep for 7- 9 hours a day.  - she is  advised to maintain close follow up with Sharilyn Sites, MD for primary care needs, as well as her other providers for optimal and coordinated care.   I spent 62 minutes in the care of the patient today including review of labs from [facility], Lipids, Thyroid Function, Hematology (current and previous including abstractions from other facilities); face-to-face time discussing  her blood glucose readings/logs, discussing hypoglycemia and hyperglycemia episodes and symptoms, medications doses, her options of short and long term treatment based on the latest standards of care / guidelines;  discussion about incorporating lifestyle medicine;  and documenting the encounter. Risk reduction counseling performed per USPSTF guidelines to reduce  obesity and cardiovascular risk factors.      Please refer to Patient Instructions for Blood Glucose Monitoring and Insulin/Medications Dosing Guide"  in media tab for additional information. Please  also refer to " Patient Self Inventory" in the Media  tab for reviewed elements of pertinent patient history.  Corliss Marcus participated in the discussions, expressed understanding, and voiced agreement with the above plans.  All questions were answered to her satisfaction. she is encouraged to contact clinic should she have any questions or concerns prior to her return visit.   Follow up plan: - Return in about 8 weeks (around 10/23/2022) for F/U with Pre-visit  Labs, Meter/CGM/Logs, A1c here.  Glade Lloyd, MD [facility] [address] [place], [place] [address] Phone: [phone]  Fax: [phone]    08/28/2022, 12:19 PM  This note was partially dictated with voice recognition software. Similar sounding words can be transcribed inadequately or may not  be corrected upon review.

## 2022-10-20 LAB — LIPID PANEL
Chol/HDL Ratio: 4.3 ratio (ref 0.0–4.4)
Cholesterol, Total: 169 mg/dL (ref 100–199)
HDL: 39 mg/dL — ABNORMAL LOW (ref >39)
LDL Chol Calc (NIH): 98 mg/dL (ref 0–99)
Triglycerides: 186 mg/dL — ABNORMAL HIGH (ref 0–149)
VLDL Cholesterol Cal: 32 mg/dL (ref 5–40)

## 2022-10-20 LAB — COMPREHENSIVE METABOLIC PANEL
ALT: 18 IU/L (ref 0–32)
AST: 15 IU/L (ref 0–40)
Albumin/Globulin Ratio: 2 (ref 1.2–2.2)
Albumin: 4.5 g/dL (ref 3.9–4.9)
Alkaline Phosphatase: 45 IU/L (ref 44–121)
BUN/Creatinine Ratio: 18 (ref 12–28)
BUN: 13 mg/dL (ref 8–27)
Bilirubin Total: 0.2 mg/dL (ref 0.0–1.2)
CO2: 22 mmol/L (ref 20–29)
Calcium: 9.9 mg/dL (ref 8.7–10.3)
Chloride: 104 mmol/L (ref 96–106)
Creatinine, Ser: 0.73 mg/dL (ref 0.57–1.00)
Globulin, Total: 2.2 g/dL (ref 1.5–4.5)
Glucose: 186 mg/dL — ABNORMAL HIGH (ref 70–99)
Potassium: 4.1 mmol/L (ref 3.5–5.2)
Sodium: 142 mmol/L (ref 134–144)
Total Protein: 6.7 g/dL (ref 6.0–8.5)
eGFR: 92 mL/min/{1.73_m2} (ref >59)

## 2022-10-20 LAB — TSH: TSH: 2.53 u[IU]/mL (ref 0.450–4.500)

## 2022-10-20 LAB — VITAMIN D 25 HYDROXY (VIT D DEFICIENCY, FRACTURES): Vit D, 25-Hydroxy: 27.4 ng/mL — ABNORMAL LOW (ref 30.0–100.0)

## 2022-10-20 LAB — T4, FREE: Free T4: 1.19 ng/dL (ref 0.82–1.77)

## 2022-10-20 LAB — VITAMIN B12: Vitamin B-12: 348 pg/mL (ref 232–1245)

## 2022-10-29 ENCOUNTER — Encounter: Payer: Self-pay | Admitting: "Endocrinology

## 2022-10-29 ENCOUNTER — Ambulatory Visit: Payer: PRIVATE HEALTH INSURANCE | Admitting: "Endocrinology

## 2022-10-29 VITALS — BP 136/78 | HR 84 | Ht 67.0 in | Wt 171.0 lb

## 2022-10-29 DIAGNOSIS — Z7984 Long term (current) use of oral hypoglycemic drugs: Secondary | ICD-10-CM

## 2022-10-29 DIAGNOSIS — I10 Essential (primary) hypertension: Secondary | ICD-10-CM | POA: Diagnosis not present

## 2022-10-29 DIAGNOSIS — E1165 Type 2 diabetes mellitus with hyperglycemia: Secondary | ICD-10-CM | POA: Diagnosis not present

## 2022-10-29 DIAGNOSIS — E782 Mixed hyperlipidemia: Secondary | ICD-10-CM

## 2022-10-29 LAB — POCT GLYCOSYLATED HEMOGLOBIN (HGB A1C): HbA1c, POC (controlled diabetic range): 7.6 % — AB (ref 0.0–7.0)

## 2022-10-29 MED ORDER — GLIPIZIDE ER 5 MG PO TB24: 5.0000 mg | ORAL_TABLET | Freq: Every day | ORAL | 1 refills | Status: DC

## 2022-10-29 NOTE — Patient Instructions (Signed)

## 2022-10-29 NOTE — Progress Notes (Unsigned)
10/29/2022, 5:08 PM  Endocrinology follow-up note   Subjective:    Patient ID: Marie Bell, female    DOB: [birth year].  Marie Bell is being seen in follow-up after she was seen in consultation for management of currently uncontrolled symptomatic diabetes requested by  Assunta Found, MD.   Past Medical History:  Diagnosis Date   DM (diabetes mellitus) (HCC)    Hyperlipidemia    Hypertension     Past Surgical History:  Procedure Laterality Date   CHOLECYSTECTOMY     TOTAL VAGINAL HYSTERECTOMY      Social History   Socioeconomic History   Marital status: Married    Spouse name: Not on file   Number of children: 2   Years of education: Not on file   Highest education level: Not on file  Occupational History   Occupation: HR  Tobacco Use   Smoking status: Every Day    Types: E-cigarettes    Last attempt to quit: 06/18/2006    Years since quitting: 16.3   Smokeless tobacco: Never  Vaping Use   Vaping Use: Former  Substance and Sexual Activity   Alcohol use: No   Drug use: No   Sexual activity: Not on file  Other Topics Concern   Not on file  Social History Narrative   Not on file   Social Determinants of Health   Financial Resource Strain: Not on file  Food Insecurity: Not on file  Transportation Needs: Not on file  Physical Activity: Not on file  Stress: Not on file  Social Connections: Not on file    Family History  Problem Relation Age of Onset   Hypertension Mother    Arthritis Mother    Liver disease Father    Alcoholism Father    Diabetes Maternal Grandmother    Colon cancer Maternal Grandfather     Outpatient Encounter Medications as of 10/29/2022  Medication Sig   amLODipine (NORVASC) 5 MG tablet Take 5 mg by mouth daily.   atorvastatin (LIPITOR) 40 MG tablet Take 1 tablet (40 mg total) by mouth daily.   bisoprolol-hydrochlorothiazide (ZIAC) 2.5-6.25  MG tablet Take 1 tablet by mouth daily.   fenofibrate 160 MG tablet Take 1 tablet (160 mg total) by mouth daily.   glipiZIDE (GLUCOTROL XL) 5 MG 24 hr tablet Take 1 tablet (5 mg total) by mouth daily with breakfast.   losartan (COZAAR) 50 MG tablet Take 100 mg by mouth daily.   metFORMIN (GLUCOPHAGE) 1000 MG tablet Take 1 tablet (1,000 mg total) by mouth 2 (two) times daily with a meal.   OVER THE COUNTER MEDICATION CBD gummie daily   [DISCONTINUED] glipiZIDE (GLUCOTROL XL) 2.5 MG 24 hr tablet Take 1 tablet (2.5 mg total) by mouth daily with breakfast.   No facility-administered encounter medications on file as of 10/29/2022.    ALLERGIES: Allergies  Allergen Reactions   Simvastatin Other (See Comments)    Leg cramps   Penicillins    Augmentin [Amoxicillin-Pot Clavulanate] Rash   Cyclobenzaprine Palpitations    Body aches,nausea,weakness   Sulfa Antibiotics Rash    VACCINATION STATUS:  There is no immunization history on file for this patient.  Diabetes She presents for her follow-up diabetic visit. She has type 2 diabetes mellitus. Onset time: She was diagnosed at approximate age of 52 years. Her disease course has been improving. There are no hypoglycemic associated symptoms. Pertinent negatives for hypoglycemia include no confusion, headaches, pallor or seizures. Associated symptoms include fatigue. Pertinent negatives for diabetes include no chest pain, no polydipsia, no polyphagia and no polyuria. There are no hypoglycemic complications. Symptoms are improving. Diabetic complications include a CVA. Risk factors for coronary artery disease include dyslipidemia, hypertension, sedentary lifestyle and tobacco exposure. Current diabetic treatments: She is currently taking metformin 1000 mg p.o. twice daily, glimepiride 2 mg twice daily. Her weight is fluctuating minimally. She is following a generally unhealthy diet. When asked about meal planning, she reported none. She rarely  participates in exercise. Her home blood glucose trend is decreasing steadily. Her breakfast blood glucose range is generally 130-140 mg/dl. Her bedtime blood glucose range is generally 140-180 mg/dl. Her overall blood glucose range is 140-180 mg/dl. (She presents with improved glycemic profile both fasting and postprandial.  Her average blood glucose is between 150-165 with point-of-care A1c of 7.6% improving from 8.9%.  ) An ACE inhibitor/angiotensin II receptor blocker is being taken.  Hyperlipidemia This is a chronic problem. The problem is uncontrolled. Exacerbating diseases include diabetes. Pertinent negatives include no chest pain, myalgias or shortness of breath. Current antihyperlipidemic treatment includes statins and fibric acid derivatives. Risk factors for coronary artery disease include diabetes mellitus, dyslipidemia, family history, hypertension, a sedentary lifestyle and post-menopausal.  Hypertension This is a chronic problem. Pertinent negatives include no chest pain, headaches, palpitations or shortness of breath. Risk factors for coronary artery disease include dyslipidemia, diabetes mellitus, sedentary lifestyle, smoking/tobacco exposure and post-menopausal state. Past treatments include angiotensin blockers, beta blockers and calcium channel blockers. Hypertensive end-organ damage includes CVA.     Review of Systems  Constitutional:  Positive for fatigue. Negative for chills, fever and unexpected weight change.  HENT:  Negative for trouble swallowing and voice change.   Eyes:  Negative for visual disturbance.  Respiratory:  Negative for cough, shortness of breath and wheezing.   Cardiovascular:  Negative for chest pain, palpitations and leg swelling.  Gastrointestinal:  Negative for diarrhea, nausea and vomiting.  Endocrine: Negative for cold intolerance, heat intolerance, polydipsia, polyphagia and polyuria.  Musculoskeletal:  Negative for arthralgias and myalgias.  Skin:   Negative for color change, pallor, rash and wound.  Neurological:  Negative for seizures and headaches.  Psychiatric/Behavioral:  Negative for confusion and suicidal ideas.     Objective:       10/29/2022   11:35 AM 08/28/2022    9:27 AM 11/08/2020    6:10 AM  Vitals with BMI  Height 5\' 7"  5\' 7"    Weight 171 lbs 173 lbs 13 oz   BMI 26.78 27.21   Systolic 136 138 409  Diastolic 78 80 85  Pulse 84 80 69    BP 136/78   Pulse 84   Ht 5\' 7"  (1.702 m)   Wt 171 lb (77.6 kg)   BMI 26.78 kg/m   Wt Readings from Last 3 Encounters:  10/29/22 171 lb (77.6 kg)  08/28/22 173 lb 12.8 oz (78.8 kg)  11/07/20 182 lb 15.7 oz (83 kg)      CMP ( most recent) CMP     Component Value Date/Time   NA 142 10/19/2022 0751   K 4.1 10/19/2022 0751  CL 104 10/19/2022 0751   CO2 22 10/19/2022 0751   GLUCOSE 186 (H) 10/19/2022 0751   GLUCOSE 139 (H) 11/07/2020 1016   BUN 13 10/19/2022 0751   CREATININE 0.73 10/19/2022 0751   CALCIUM 9.9 10/19/2022 0751   PROT 6.7 10/19/2022 0751   ALBUMIN 4.5 10/19/2022 0751   AST 15 10/19/2022 0751   ALT 18 10/19/2022 0751   ALKPHOS 45 10/19/2022 0751   BILITOT 0.2 10/19/2022 0751   GFRNONAA >60 11/07/2020 1016     Diabetic Labs (most recent): Lab Results  Component Value Date   HGBA1C 7.6 (A) 10/29/2022   HGBA1C 8.9 07/18/2022   HGBA1C 7.1 (H) 11/08/2020     Lipid Panel ( most recent) Lipid Panel     Component Value Date/Time   CHOL 169 10/19/2022 0751   TRIG 186 (H) 10/19/2022 0751   HDL 39 (L) 10/19/2022 0751   CHOLHDL 4.3 10/19/2022 0751   CHOLHDL 6.4 11/08/2020 0518   VLDL UNABLE TO CALCULATE IF TRIGLYCERIDE OVER 400 mg/dL 16/03/9603 5409   LDLCALC 98 10/19/2022 0751   LDLDIRECT 82.7 11/08/2020 0518   LABVLDL 32 10/19/2022 0751      Lab Results  Component Value Date   TSH 2.530 10/19/2022   TSH 1.64 07/18/2022   TSH 3.150 01/21/2017   FREET4 1.19 10/19/2022       Assessment & Plan:   1. Type 2 diabetes mellitus with  hyperglycemia, without long-term current use of insulin (HCC)   - TANAIRI GENO has currently uncontrolled symptomatic type 2 DM since  67 years of age.  She presents with improved glycemic profile both fasting and postprandial.  Her average blood glucose is between 150-165 with point-of-care A1c of 7.6% improving from 8.9%.   Recent labs reviewed. - I had a long discussion with her about the possible risk factors and  the pathology behind its diabetes and its complications. -her diabetes is complicated by CVA, comorbid hyperlipidemia and hypertension, history of smoking and she remains at a high risk for more acute and chronic complications which include CAD, CVA, CKD, retinopathy, and neuropathy. These are all discussed in detail with her.  - I discussed all available options of managing her diabetes including de-escalation of medications. I have counseled her on Food as Medicine by adopting a Whole Food , Plant Predominant  ( WFPP) nutrition as recommended by [employer] of Lifestyle Medicine. Patient is encouraged to switch to  unprocessed or minimally processed  complex starch, adequate protein intake (mainly plant source), minimal liquid fat, plenty of fruits, and vegetables. -  she is advised to stick to a routine mealtimes to eat 3 complete meals a day and snack only when necessary ( to snack only to correct hypoglycemia BG <70 day time or <100 at night).   - she acknowledges that there is a room for improvement in her food and drink choices. - Suggestion is made for her to avoid simple carbohydrates  from her diet including Cakes, Sweet Desserts, Ice Cream, Soda (diet and regular), Sweet Tea, Candies, Chips, Cookies, Store Bought Juices, Alcohol , Artificial Sweeteners,  Coffee Creamer, and "Sugar-free" Products, Lemonade. This will help patient to have more stable blood glucose profile and potentially avoid unintended weight gain.  The following Lifestyle Medicine recommendations  according to American College of Lifestyle Medicine  [facility]) were discussed and and offered to patient and she  agrees to start the journey:  A. Whole Foods, Plant-Based Nutrition comprising of fruits and vegetables, plant-based  proteins, whole-grain carbohydrates was discussed in detail with the patient.   A list for source of those nutrients were also provided to the patient.  Patient will use only water or unsweetened tea for hydration. B.  The need to stay away from risky substances including alcohol, smoking; obtaining 7 to 9 hours of restorative sleep, at least 150 minutes of moderate intensity exercise weekly, the importance of healthy social connections,  and stress management techniques were discussed. C.  A full color page of  Calorie density of various food groups per pound showing examples of each food groups was provided to the patient.    - she will be scheduled with Norm Salt, RDN, CDE for individualized diabetes education.  - I have approached her with the following individualized plan to manage  her diabetes and patient agrees:   -She wishes to avoid further escalation in her medications. -Her engagement with lifestyle medicine is not optimal yet.  I discussed and increased her glipizide to 5 mg p.o. daily at breakfast.  She is advised to continue metformin 1000 mg p.o. twice daily.   She is advised to continue monitoring blood glucose twice a day-daily before breakfast and at bedtime. -She has options including SGLT2 inhibitors or weekly GLP-1 receptor agonist or basal insulin if she loses control on subsequent visits.   - Specific targets for  A1c;  LDL, HDL,  and Triglycerides were discussed with the patient.  2) Blood Pressure /Hypertension:   Her blood pressure is controlled to target.   she is advised to continue her current medications including amlodipine 5 mg p.o. daily, bisoprolol-hydrochlorothiazide 2.5-6.25 mg p.o. twice daily, losartan 50 mg p.o. daily.    3)  Lipids/Hyperlipidemia:   Review of her recent lipid panel showed uncontrolled  LDL at 98 and triglycerides improving to 189 from 700.  This is consistent with mixed hyperlipidemia.  She is advised to continue atorvastatin 40 mg p.o. nightly, fenofibrate 160 mg p.o. daily.  Whole food plant-based diet discussed above will address hyperlipidemia as well.  4)  Weight/Diet:  Body mass index is 26.78 kg/m.  -     she is  a candidate for weight loss. I discussed with her the fact that loss of 5 - 10% of her  current body weight will have the most impact on her diabetes management.  The above detailed  ACLM recommendations for nutrition, exercise, sleep, social life, avoidance of risky substances, the need for restorative sleep   information will also detailed on discharge instructions.  5) Chronic Care/Health Maintenance:  -she  is on ACEI/ARB and Statin medications and  is encouraged to initiate and continue to follow up with Ophthalmology, Dentist,  Podiatrist at least yearly or according to recommendations, and advised to   stay away from smoking. I have recommended yearly flu vaccine and pneumonia vaccine at least every 5 years; moderate intensity exercise for up to 150 minutes weekly; and  sleep for 7- 9 hours a day.  - she is  advised to maintain close follow up with Assunta Found, MD for primary care needs, as well as her other providers for optimal and coordinated care.  I spent  26  minutes in the care of the patient today including review of labs from CMP, Lipids, Thyroid Function, Hematology (current and previous including abstractions from other facilities); face-to-face time discussing  her blood glucose readings/logs, discussing hypoglycemia and hyperglycemia episodes and symptoms, medications doses, her options of short and long term treatment based on the  latest standards of care / guidelines;  discussion about incorporating lifestyle medicine;  and documenting the encounter. Risk reduction  counseling performed per USPSTF guidelines to reduce  obesity and cardiovascular risk factors.     Please refer to Patient Instructions for Blood Glucose Monitoring and Insulin/Medications Dosing Guide"  in media tab for additional information. Please  also refer to " Patient Self Inventory" in the Media  tab for reviewed elements of pertinent patient history.  Apolinar Junes participated in the discussions, expressed understanding, and voiced agreement with the above plans.  All questions were answered to her satisfaction. she is encouraged to contact clinic should she have Marie questions or concerns prior to her return visit.   Follow up plan: - Return in about 3 months (around 01/29/2023) for Bring Meter/CGM Device/Logs- A1c in Office.  Marquis Lunch, MD [facility] [address] [place], [place] [address] Phone: [phone]  Fax: [phone]    10/29/2022, 5:08 PM  This note was partially dictated with voice recognition software. Similar sounding words can be transcribed inadequately or may not  be corrected upon review.

## 2022-11-01 DIAGNOSIS — Z7984 Long term (current) use of oral hypoglycemic drugs: Secondary | ICD-10-CM | POA: Insufficient documentation

## 2023-01-14 DIAGNOSIS — I10 Essential (primary) hypertension: Secondary | ICD-10-CM | POA: Diagnosis not present

## 2023-01-14 DIAGNOSIS — E782 Mixed hyperlipidemia: Secondary | ICD-10-CM | POA: Diagnosis not present

## 2023-01-14 DIAGNOSIS — Z0000 Encounter for general adult medical examination without abnormal findings: Secondary | ICD-10-CM | POA: Diagnosis not present

## 2023-01-14 DIAGNOSIS — Z1331 Encounter for screening for depression: Secondary | ICD-10-CM | POA: Diagnosis not present

## 2023-01-14 DIAGNOSIS — Z6826 Body mass index (BMI) 26.0-26.9, adult: Secondary | ICD-10-CM | POA: Diagnosis not present

## 2023-01-14 DIAGNOSIS — I639 Cerebral infarction, unspecified: Secondary | ICD-10-CM | POA: Diagnosis not present

## 2023-01-30 ENCOUNTER — Ambulatory Visit: Payer: PRIVATE HEALTH INSURANCE | Admitting: "Endocrinology

## 2023-02-06 ENCOUNTER — Encounter: Payer: Self-pay | Admitting: "Endocrinology

## 2023-02-06 ENCOUNTER — Ambulatory Visit: Payer: Medicare Other | Admitting: "Endocrinology

## 2023-02-06 VITALS — BP 124/78 | HR 72 | Ht 67.0 in | Wt 171.8 lb

## 2023-02-06 DIAGNOSIS — E1165 Type 2 diabetes mellitus with hyperglycemia: Secondary | ICD-10-CM | POA: Diagnosis not present

## 2023-02-06 DIAGNOSIS — E782 Mixed hyperlipidemia: Secondary | ICD-10-CM

## 2023-02-06 DIAGNOSIS — Z7984 Long term (current) use of oral hypoglycemic drugs: Secondary | ICD-10-CM | POA: Diagnosis not present

## 2023-02-06 DIAGNOSIS — I10 Essential (primary) hypertension: Secondary | ICD-10-CM | POA: Diagnosis not present

## 2023-02-06 LAB — POCT GLYCOSYLATED HEMOGLOBIN (HGB A1C): HbA1c, POC (controlled diabetic range): 7.8 % — AB (ref 0.0–7.0)

## 2023-02-06 MED ORDER — EMPAGLIFLOZIN 10 MG PO TABS: 10.0000 mg | ORAL_TABLET | Freq: Every day | ORAL | 1 refills | Status: DC

## 2023-02-06 NOTE — Progress Notes (Signed)
02/06/2023, 11:09 AM  Endocrinology follow-up note   Subjective:    Patient ID: Marie Bell, female    DOB: 1955-08-13.  MOSE SELVIDGE is being seen in follow-up after she was seen in consultation for management of currently uncontrolled symptomatic diabetes requested by  Assunta Found, MD.   Past Medical History:  Diagnosis Date   DM (diabetes mellitus) (HCC)    Hyperlipidemia    Hypertension     Past Surgical History:  Procedure Laterality Date   CHOLECYSTECTOMY     TOTAL VAGINAL HYSTERECTOMY      Social History   Socioeconomic History   Marital status: Married    Spouse name: Not on file   Number of children: 2   Years of education: Not on file   Highest education level: Not on file  Occupational History   Occupation: HR  Tobacco Use   Smoking status: Every Day    Types: E-cigarettes    Last attempt to quit: 06/18/2006    Years since quitting: 16.6   Smokeless tobacco: Never  Vaping Use   Vaping status: Former  Substance and Sexual Activity   Alcohol use: No   Drug use: No   Sexual activity: Not on file  Other Topics Concern   Not on file  Social History Narrative   Not on file   Social Determinants of Health   Financial Resource Strain: Not on file  Food Insecurity: Not on file  Transportation Needs: Not on file  Physical Activity: Not on file  Stress: Not on file  Social Connections: Not on file    Family History  Problem Relation Age of Onset   Hypertension Mother    Arthritis Mother    Liver disease Father    Alcoholism Father    Diabetes Maternal Grandmother    Colon cancer Maternal Grandfather     Outpatient Encounter Medications as of 02/06/2023  Medication Sig   amLODipine (NORVASC) 5 MG tablet Take 5 mg by mouth daily.   atorvastatin (LIPITOR) 40 MG tablet Take 1 tablet (40 mg total) by mouth daily.   bisoprolol-hydrochlorothiazide (ZIAC)  2.5-6.25 MG tablet Take 1 tablet by mouth daily.   fenofibrate 160 MG tablet Take 1 tablet (160 mg total) by mouth daily.   glipiZIDE (GLUCOTROL XL) 5 MG 24 hr tablet Take 1 tablet (5 mg total) by mouth daily with breakfast.   losartan (COZAAR) 50 MG tablet Take 100 mg by mouth daily.   metFORMIN (GLUCOPHAGE) 1000 MG tablet Take 1 tablet (1,000 mg total) by mouth 2 (two) times daily with a meal.   OVER THE COUNTER MEDICATION CBD gummie daily   No facility-administered encounter medications on file as of 02/06/2023.    ALLERGIES: Allergies  Allergen Reactions   Simvastatin Other (See Comments)    Leg cramps   Penicillins    Augmentin [Amoxicillin-Pot Clavulanate] Rash   Cyclobenzaprine Palpitations    Body aches,nausea,weakness   Sulfa Antibiotics Rash    VACCINATION STATUS:  There is no immunization history on file for this patient.  Diabetes She presents for her follow-up diabetic visit. She has  type 2 diabetes mellitus. Onset time: She was diagnosed at approximate age of 52 years. Her disease course has been worsening. There are no hypoglycemic associated symptoms. Pertinent negatives for hypoglycemia include no confusion, headaches, pallor or seizures. Associated symptoms include fatigue. Pertinent negatives for diabetes include no chest pain, no polydipsia, no polyphagia and no polyuria. There are no hypoglycemic complications. Symptoms are worsening. Diabetic complications include a CVA. Risk factors for coronary artery disease include dyslipidemia, hypertension, sedentary lifestyle and tobacco exposure. Current diabetic treatments: She is currently taking metformin 1000 mg p.o. twice daily, glimepiride 2 mg twice daily. Her weight is fluctuating minimally. She is following a generally unhealthy diet. When asked about meal planning, she reported none. She rarely participates in exercise. Her home blood glucose trend is increasing steadily. Her breakfast blood glucose range is generally  140-180 mg/dl. Her bedtime blood glucose range is generally 180-200 mg/dl. Her overall blood glucose range is 180-200 mg/dl. (She presents with above target glycemic profile, point-of-care A1c of 7.8%.  She did not document any hypoglycemia. ) An ACE inhibitor/angiotensin II receptor blocker is being taken.  Hyperlipidemia This is a chronic problem. The problem is uncontrolled. Exacerbating diseases include diabetes. Pertinent negatives include no chest pain, myalgias or shortness of breath. Current antihyperlipidemic treatment includes statins and fibric acid derivatives. Risk factors for coronary artery disease include diabetes mellitus, dyslipidemia, family history, hypertension, a sedentary lifestyle and post-menopausal.  Hypertension This is a chronic problem. Pertinent negatives include no chest pain, headaches, palpitations or shortness of breath. Risk factors for coronary artery disease include dyslipidemia, diabetes mellitus, sedentary lifestyle, smoking/tobacco exposure and post-menopausal state. Past treatments include angiotensin blockers, beta blockers and calcium channel blockers. Hypertensive end-organ damage includes CVA.     Review of Systems  Constitutional:  Positive for fatigue. Negative for chills, fever and unexpected weight change.  HENT:  Negative for trouble swallowing and voice change.   Eyes:  Negative for visual disturbance.  Respiratory:  Negative for cough, shortness of breath and wheezing.   Cardiovascular:  Negative for chest pain, palpitations and leg swelling.  Gastrointestinal:  Negative for diarrhea, nausea and vomiting.  Endocrine: Negative for cold intolerance, heat intolerance, polydipsia, polyphagia and polyuria.  Musculoskeletal:  Negative for arthralgias and myalgias.  Skin:  Negative for color change, pallor, rash and wound.  Neurological:  Negative for seizures and headaches.  Psychiatric/Behavioral:  Negative for confusion and suicidal ideas.      Objective:       02/06/2023   10:58 AM 10/29/2022   11:35 AM 08/28/2022    9:27 AM  Vitals with BMI  Height 5\' 7"  5\' 7"  5\' 7"   Weight 171 lbs 13 oz 171 lbs 173 lbs 13 oz  BMI 26.9 26.78 27.21  Systolic 124 136 161  Diastolic 78 78 80  Pulse 72 84 80    BP 124/78   Pulse 72   Ht 5\' 7"  (1.702 m)   Wt 171 lb 12.8 oz (77.9 kg)   BMI 26.91 kg/m   Wt Readings from Last 3 Encounters:  02/06/23 171 lb 12.8 oz (77.9 kg)  10/29/22 171 lb (77.6 kg)  08/28/22 173 lb 12.8 oz (78.8 kg)      CMP ( most recent) CMP     Component Value Date/Time   NA 142 10/19/2022 0751   K 4.1 10/19/2022 0751   CL 104 10/19/2022 0751   CO2 22 10/19/2022 0751   GLUCOSE 186 (H) 10/19/2022 0751   GLUCOSE 139 (H) 11/07/2020  1016   BUN 13 10/19/2022 0751   CREATININE 0.73 10/19/2022 0751   CALCIUM 9.9 10/19/2022 0751   PROT 6.7 10/19/2022 0751   ALBUMIN 4.5 10/19/2022 0751   AST 15 10/19/2022 0751   ALT 18 10/19/2022 0751   ALKPHOS 45 10/19/2022 0751   BILITOT 0.2 10/19/2022 0751   GFRNONAA >60 11/07/2020 1016     Diabetic Labs (most recent): Lab Results  Component Value Date   HGBA1C 7.6 (A) 10/29/2022   HGBA1C 8.9 07/18/2022   HGBA1C 7.1 (H) 11/08/2020     Lipid Panel ( most recent) Lipid Panel     Component Value Date/Time   CHOL 169 10/19/2022 0751   TRIG 186 (H) 10/19/2022 0751   HDL 39 (L) 10/19/2022 0751   CHOLHDL 4.3 10/19/2022 0751   CHOLHDL 6.4 11/08/2020 0518   VLDL UNABLE TO CALCULATE IF TRIGLYCERIDE OVER 400 mg/dL 40/98/1191 4782   LDLCALC 98 10/19/2022 0751   LDLDIRECT 82.7 11/08/2020 0518   LABVLDL 32 10/19/2022 0751      Lab Results  Component Value Date   TSH 2.530 10/19/2022   TSH 1.64 07/18/2022   TSH 3.150 01/21/2017   FREET4 1.19 10/19/2022       Assessment & Plan:   1. Type 2 diabetes mellitus with hyperglycemia, without long-term current use of insulin (HCC)   - JERNEI PADILLO has currently uncontrolled symptomatic type 2 DM since  67  years of age.   She presents with above target glycemic profile, point-of-care A1c of 7.8%.  She did not document any hypoglycemia.   Recent labs reviewed. - I had a long discussion with her about the possible risk factors and  the pathology behind its diabetes and its complications. -her diabetes is complicated by CVA, comorbid hyperlipidemia and hypertension, history of smoking and she remains at a high risk for more acute and chronic complications which include CAD, CVA, CKD, retinopathy, and neuropathy. These are all discussed in detail with her.  - I discussed all available options of managing her diabetes including de-escalation of medications. I have counseled her on Food as Medicine by adopting a Whole Food , Plant Predominant  ( WFPP) nutrition as recommended by [employer] of Lifestyle Medicine. Patient is encouraged to switch to  unprocessed or minimally processed  complex starch, adequate protein intake (mainly plant source), minimal liquid fat, plenty of fruits, and vegetables. -  she is advised to stick to a routine mealtimes to eat 3 complete meals a day and snack only when necessary ( to snack only to correct hypoglycemia BG <70 day time or <100 at night).   - she acknowledges that there is a room for improvement in her food and drink choices. - Suggestion is made for her to avoid simple carbohydrates  from her diet including Cakes, Sweet Desserts, Ice Cream, Soda (diet and regular), Sweet Tea, Candies, Chips, Cookies, Store Bought Juices, Alcohol , Artificial Sweeteners,  Coffee Creamer, and "Sugar-free" Products, Lemonade. This will help patient to have more stable blood glucose profile and potentially avoid unintended weight gain.  The following Lifestyle Medicine recommendations according to American College of Lifestyle Medicine  [place]) were discussed and and offered to patient and she  agrees to start the journey:  A. Whole Foods, Plant-Based Nutrition comprising of fruits  and vegetables, plant-based proteins, whole-grain carbohydrates was discussed in detail with the patient.   A list for source of those nutrients were also provided to the patient.  Patient will use only  water or unsweetened tea for hydration. B.  The need to stay away from risky substances including alcohol, smoking; obtaining 7 to 9 hours of restorative sleep, at least 150 minutes of moderate intensity exercise weekly, the importance of healthy social connections,  and stress management techniques were discussed. C.  A full color page of  Calorie density of various food groups per pound showing examples of each food groups was provided to the patient.   - she will be scheduled with Norm Salt, RDN, CDE for individualized diabetes education.  - I have approached her with the following individualized plan to manage  her diabetes and patient agrees:   -Her engagement with lifestyle medicine is not optimal yet.  She will benefit from an SGLT2 inhibitor.  I discussed and added Jardiance 10 mg p.o. daily at breakfast.  Side effects and precautions discussed with her.   She is advised to continue monitoring blood glucose twice a day-daily before breakfast and at bedtime. -In light of her previous history of TIA, she was offered GLP-1 receptor agonist, however patient declines this option for now. -She is advised to continue metformin 1000 mg p.o. twice daily.  She is advised to discontinue glipizide at this time.  - Specific targets for  A1c;  LDL, HDL,  and Triglycerides were discussed with the patient.  2) Blood Pressure /Hypertension:   Her blood pressure is controlled to target.   she is advised to continue her current medications including amlodipine 5 mg p.o. daily, bisoprolol-hydrochlorothiazide 2.5-6.25 mg p.o. twice daily, losartan 50 mg p.o. daily.    3) Lipids/Hyperlipidemia:   Review of her recent lipid panel showed uncontrolled  LDL at 98 and triglycerides improving to 189 from 700.   This is consistent with mixed hyperlipidemia.  She is advised to continue  atorvastatin 40 mg p.o. nightly, fenofibrate 160 mg p.o. daily.  Whole food plant-based diet discussed above will address hyperlipidemia as well.  4)  Weight/Diet:  Body mass index is 26.91 kg/m.  -     she is  a candidate for weight loss. I discussed with her the fact that loss of 5 - 10% of her  current body weight will have the most impact on her diabetes management.  The above detailed  ACLM recommendations for nutrition, exercise, sleep, social life, avoidance of risky substances, the need for restorative sleep   information will also detailed on discharge instructions.  5) Chronic Care/Health Maintenance:  -she  is on ACEI/ARB and Statin medications and  is encouraged to initiate and continue to follow up with Ophthalmology, Dentist,  Podiatrist at least yearly or according to recommendations, and advised to   stay away from smoking. I have recommended yearly flu vaccine and pneumonia vaccine at least every 5 years; moderate intensity exercise for up to 150 minutes weekly; and  sleep for 7- 9 hours a day.  - she is  advised to maintain close follow up with Assunta Found, MD for primary care needs, as well as her other providers for optimal and coordinated care.    I spent  26  minutes in the care of the patient today including review of labs from CMP, Lipids, Thyroid Function, Hematology (current and previous including abstractions from other facilities); face-to-face time discussing  her blood glucose readings/logs, discussing hypoglycemia and hyperglycemia episodes and symptoms, medications doses, her options of short and long term treatment based on the latest standards of care / guidelines;  discussion about incorporating lifestyle medicine;  and documenting  the encounter. Risk reduction counseling performed per USPSTF guidelines to reduce cardiovascular risk factors.     Please refer to Patient Instructions for  Blood Glucose Monitoring and Insulin/Medications Dosing Guide"  in media tab for additional information. Please  also refer to " Patient Self Inventory" in the Media  tab for reviewed elements of pertinent patient history.  Apolinar Junes participated in the discussions, expressed understanding, and voiced agreement with the above plans.  All questions were answered to her satisfaction. she is encouraged to contact clinic should she have any questions or concerns prior to her return visit.    Follow up plan: - No follow-ups on file.  Marquis Lunch, MD [facility] [address] [place], [place] [address] Phone: [phone]  Fax: [phone]    02/06/2023, 11:09 AM  This note was partially dictated with voice recognition software. Similar sounding words can be transcribed inadequately or may not  be corrected upon review.

## 2023-02-06 NOTE — Patient Instructions (Signed)
                                     Advice for Weight Management  -For most of us the best way to lose weight is by diet management. Generally speaking, diet management means consuming less calories intentionally which over time brings about progressive weight loss.  This can be achieved more effectively by avoiding ultra processed carbohydrates, processed meats, unhealthy fats.    It is critically important to know your numbers: how much calorie you are consuming and how much calorie you need. More importantly, our carbohydrates sources should be unprocessed naturally occurring  complex starch food items.  It is always important to balance nutrition also by  appropriate intake of proteins (mainly plant-based), healthy fats/oils, plenty of fruits and vegetables.   -The American College of Lifestyle Medicine (ACL M) recommends nutrition derived mostly from Whole Food, Plant Predominant Sources example an apple instead of applesauce or apple pie. Eat Plenty of vegetables, Mushrooms, fruits, Legumes, Whole Grains, Nuts, seeds in lieu of processed meats, processed snacks/pastries red meat, poultry, eggs.  Use only water or unsweetened tea for hydration.  The College also recommends the need to stay away from risky substances including alcohol, smoking; obtaining 7-9 hours of restorative sleep, at least 150 minutes of moderate intensity exercise weekly, importance of healthy social connections, and being mindful of stress and seek help when it is overwhelming.    -Sticking to a routine mealtime to eat 3 meals a day and avoiding unnecessary snacks is shown to have a big role in weight control. Under normal circumstances, the only time we burn stored energy is when we are hungry, so allow  some hunger to take place- hunger means no food between appropriate meal times, only water.  It is not advisable to starve.   -It is better to avoid simple carbohydrates including:  Cakes, Sweet Desserts, Ice Cream, Soda (diet and regular), Sweet Tea, Candies, Chips, Cookies, Store Bought Juices, Alcohol in Excess of  1-2 drinks a day, Lemonade,  Artificial Sweeteners, Doughnuts, Coffee Creamers, "Sugar-free" Products, etc, etc.  This is not a complete list.....    -Consulting with certified diabetes educators is proven to provide you with the most accurate and current information on diet.  Also, you may be  interested in discussing diet options/exchanges , we can schedule a visit with Marie Bell, RDN, CDE for individualized nutrition education.  -Exercise: If you are able: 30 -60 minutes a day ,4 days a week, or 150 minutes of moderate intensity exercise weekly.    The longer the better if tolerated.  Combine stretch, strength, and aerobic activities.  If you were told in the past that you have high risk for cardiovascular diseases, or if you are currently symptomatic, you may seek evaluation by your heart doctor prior to initiating moderate to intense exercise programs.                                  Additional Care Considerations for Diabetes/Prediabetes   -Diabetes  is a chronic disease.  The most important care consideration is regular follow-up with your diabetes care provider with the goal being avoiding or delaying its complications and to take advantage of advances in medications and technology.  If appropriate actions are taken early enough, type 2 diabetes can even be   reversed.  Seek information from the right source.  - Whole Food, Plant Predominant Nutrition is highly recommended: Eat Plenty of vegetables, Mushrooms, fruits, Legumes, Whole Grains, Nuts, seeds in lieu of processed meats, processed snacks/pastries red meat, poultry, eggs as recommended by American College of  Lifestyle Medicine (ACLM).  -Type 2 diabetes is known to coexist with other important comorbidities such as high blood pressure and high cholesterol.  It is critical to control not only the  diabetes but also the high blood pressure and high cholesterol to minimize and delay the risk of complications including coronary artery disease, stroke, amputations, blindness, etc.  The good news is that this diet recommendation for type 2 diabetes is also very helpful for managing high cholesterol and high blood blood pressure.  - Studies showed that people with diabetes will benefit from a class of medications known as ACE inhibitors and statins.  Unless there are specific reasons not to be on these medications, the standard of care is to consider getting one from these groups of medications at an optimal doses.  These medications are generally considered safe and proven to help protect the heart and the kidneys.    - People with diabetes are encouraged to initiate and maintain regular follow-up with eye doctors, foot doctors, dentists , and if necessary heart and kidney doctors.     - It is highly recommended that people with diabetes quit smoking or stay away from smoking, and get yearly  flu vaccine and pneumonia vaccine at least every 5 years.  See above for additional recommendations on exercise, sleep, stress management , and healthy social connections.      

## 2023-02-16 DIAGNOSIS — R1084 Generalized abdominal pain: Secondary | ICD-10-CM | POA: Diagnosis not present

## 2023-02-16 DIAGNOSIS — M545 Low back pain, unspecified: Secondary | ICD-10-CM | POA: Diagnosis not present

## 2023-02-16 DIAGNOSIS — Z6828 Body mass index (BMI) 28.0-28.9, adult: Secondary | ICD-10-CM | POA: Diagnosis not present

## 2023-02-16 DIAGNOSIS — E663 Overweight: Secondary | ICD-10-CM | POA: Diagnosis not present

## 2023-02-21 ENCOUNTER — Ambulatory Visit: Payer: Medicare Other | Admitting: General Surgery

## 2023-04-01 ENCOUNTER — Other Ambulatory Visit: Payer: Self-pay | Admitting: *Deleted

## 2023-04-01 DIAGNOSIS — Z1211 Encounter for screening for malignant neoplasm of colon: Secondary | ICD-10-CM

## 2023-04-04 ENCOUNTER — Encounter: Payer: Self-pay | Admitting: General Surgery

## 2023-04-04 ENCOUNTER — Ambulatory Visit: Payer: Medicare Other | Admitting: General Surgery

## 2023-04-04 VITALS — BP 147/73 | HR 78 | Temp 98.1°F | Resp 16 | Ht 67.0 in | Wt 172.0 lb

## 2023-04-04 DIAGNOSIS — Z1211 Encounter for screening for malignant neoplasm of colon: Secondary | ICD-10-CM

## 2023-04-04 MED ORDER — SUTAB 1479-225-188 MG PO TABS: 24.0000 | ORAL_TABLET | Freq: Once | ORAL | 0 refills | Status: AC

## 2023-04-05 NOTE — H&P (Signed)
Marie Bell; [id number]; 03-28-1956   HPI Patient is a 67 year old white female who was referred to my care by Dr. Phillips Odor for a screening colonoscopy.  She last had a colonoscopy in 2011.  She denies any immediate family history of colon cancer, weight change, abdominal pain, abnormal diarrhea or constipation, or blood in her stools. Past Medical History:  Diagnosis Date   DM (diabetes mellitus) (HCC)    Hyperlipidemia    Hypertension     Past Surgical History:  Procedure Laterality Date   CHOLECYSTECTOMY     TOTAL VAGINAL HYSTERECTOMY      Family History  Problem Relation Age of Onset   Hypertension Mother    Arthritis Mother    Liver disease Father    Alcoholism Father    Diabetes Maternal Grandmother    Colon cancer Maternal Grandfather     Current Outpatient Medications on File Prior to Visit  Medication Sig Dispense Refill   amLODipine (NORVASC) 5 MG tablet Take 5 mg by mouth daily.     atorvastatin (LIPITOR) 40 MG tablet Take 1 tablet (40 mg total) by mouth daily. 90 tablet 1   bisoprolol-hydrochlorothiazide (ZIAC) 2.5-6.25 MG tablet Take 1 tablet by mouth daily. 90 tablet 1   fenofibrate 160 MG tablet Take 1 tablet (160 mg total) by mouth daily. 30 tablet 2   glipiZIDE (GLUCOTROL XL) 5 MG 24 hr tablet Take 5 mg by mouth daily.     losartan (COZAAR) 50 MG tablet Take 100 mg by mouth daily.     metFORMIN (GLUCOPHAGE) 1000 MG tablet Take 1 tablet (1,000 mg total) by mouth 2 (two) times daily with a meal. 180 tablet 3   OVER THE COUNTER MEDICATION CBD gummie daily     No current facility-administered medications on file prior to visit.    Allergies  Allergen Reactions   Simvastatin Other (See Comments)    Leg cramps   Penicillins    Augmentin [Amoxicillin-Pot Clavulanate] Rash   Cyclobenzaprine Palpitations    Body aches,nausea,weakness   Sulfa Antibiotics Rash    Social History   Substance and Sexual Activity  Alcohol Use No    Social History    Tobacco Use  Smoking Status Every Day   Types: E-cigarettes   Last attempt to quit: 06/18/2006   Years since quitting: 16.8  Smokeless Tobacco Never    Review of Systems  Constitutional: Negative.   HENT: Negative.    Eyes: Negative.   Respiratory: Negative.    Cardiovascular: Negative.   Gastrointestinal: Negative.   Genitourinary: Negative.   Musculoskeletal:  Positive for joint pain.  Skin: Negative.   Neurological: Negative.   Endo/Heme/Allergies: Negative.   Psychiatric/Behavioral: Negative.      Objective   Vitals:   04/04/23 1002  BP: (!) 147/73  Pulse: 78  Resp: 16  Temp: 98.1 F (36.7 C)  SpO2: 94%    Physical Exam Vitals reviewed.  Constitutional:      Appearance: Normal appearance. She is normal weight. She is not ill-appearing.  HENT:     Head: Normocephalic and atraumatic.  Cardiovascular:     Rate and Rhythm: Normal rate and regular rhythm.     Heart sounds: Normal heart sounds. No murmur heard.    No friction rub. No gallop.  Pulmonary:     Effort: Pulmonary effort is normal. No respiratory distress.     Breath sounds: Normal breath sounds. No stridor. No wheezing, rhonchi or rales.  Abdominal:     General: Bowel  sounds are normal. There is no distension.     Palpations: Abdomen is soft. There is no mass.     Tenderness: There is no abdominal tenderness. There is no guarding.     Hernia: No hernia is present.  Skin:    General: Skin is warm and dry.  Neurological:     Mental Status: She is alert and oriented to person, place, and time.     Assessment  Need for screening colonoscopy Plan  Patient is scheduled for screening colonoscopy on 04/30/2023.  The risks and benefits of the procedure including bleeding and perforation were fully explained to the patient, who gave informed consent.  Sutabs have been prescribed.

## 2023-04-05 NOTE — Progress Notes (Signed)
Marie Bell; [id number]; 03-28-1956   HPI Patient is a 67 year old white female who was referred to my care by Dr. Phillips Odor for a screening colonoscopy.  She last had a colonoscopy in 2011.  She denies any immediate family history of colon cancer, weight change, abdominal pain, abnormal diarrhea or constipation, or blood in her stools. Past Medical History:  Diagnosis Date   DM (diabetes mellitus) (HCC)    Hyperlipidemia    Hypertension     Past Surgical History:  Procedure Laterality Date   CHOLECYSTECTOMY     TOTAL VAGINAL HYSTERECTOMY      Family History  Problem Relation Age of Onset   Hypertension Mother    Arthritis Mother    Liver disease Father    Alcoholism Father    Diabetes Maternal Grandmother    Colon cancer Maternal Grandfather     Current Outpatient Medications on File Prior to Visit  Medication Sig Dispense Refill   amLODipine (NORVASC) 5 MG tablet Take 5 mg by mouth daily.     atorvastatin (LIPITOR) 40 MG tablet Take 1 tablet (40 mg total) by mouth daily. 90 tablet 1   bisoprolol-hydrochlorothiazide (ZIAC) 2.5-6.25 MG tablet Take 1 tablet by mouth daily. 90 tablet 1   fenofibrate 160 MG tablet Take 1 tablet (160 mg total) by mouth daily. 30 tablet 2   glipiZIDE (GLUCOTROL XL) 5 MG 24 hr tablet Take 5 mg by mouth daily.     losartan (COZAAR) 50 MG tablet Take 100 mg by mouth daily.     metFORMIN (GLUCOPHAGE) 1000 MG tablet Take 1 tablet (1,000 mg total) by mouth 2 (two) times daily with a meal. 180 tablet 3   OVER THE COUNTER MEDICATION CBD gummie daily     No current facility-administered medications on file prior to visit.    Allergies  Allergen Reactions   Simvastatin Other (See Comments)    Leg cramps   Penicillins    Augmentin [Amoxicillin-Pot Clavulanate] Rash   Cyclobenzaprine Palpitations    Body aches,nausea,weakness   Sulfa Antibiotics Rash    Social History   Substance and Sexual Activity  Alcohol Use No    Social History    Tobacco Use  Smoking Status Every Day   Types: E-cigarettes   Last attempt to quit: 06/18/2006   Years since quitting: 16.8  Smokeless Tobacco Never    Review of Systems  Constitutional: Negative.   HENT: Negative.    Eyes: Negative.   Respiratory: Negative.    Cardiovascular: Negative.   Gastrointestinal: Negative.   Genitourinary: Negative.   Musculoskeletal:  Positive for joint pain.  Skin: Negative.   Neurological: Negative.   Endo/Heme/Allergies: Negative.   Psychiatric/Behavioral: Negative.      Objective   Vitals:   04/04/23 1002  BP: (!) 147/73  Pulse: 78  Resp: 16  Temp: 98.1 F (36.7 C)  SpO2: 94%    Physical Exam Vitals reviewed.  Constitutional:      Appearance: Normal appearance. She is normal weight. She is not ill-appearing.  HENT:     Head: Normocephalic and atraumatic.  Cardiovascular:     Rate and Rhythm: Normal rate and regular rhythm.     Heart sounds: Normal heart sounds. No murmur heard.    No friction rub. No gallop.  Pulmonary:     Effort: Pulmonary effort is normal. No respiratory distress.     Breath sounds: Normal breath sounds. No stridor. No wheezing, rhonchi or rales.  Abdominal:     General: Bowel  sounds are normal. There is no distension.     Palpations: Abdomen is soft. There is no mass.     Tenderness: There is no abdominal tenderness. There is no guarding.     Hernia: No hernia is present.  Skin:    General: Skin is warm and dry.  Neurological:     Mental Status: She is alert and oriented to person, place, and time.     Assessment  Need for screening colonoscopy Plan  Patient is scheduled for screening colonoscopy on 04/30/2023.  The risks and benefits of the procedure including bleeding and perforation were fully explained to the patient, who gave informed consent.  Sutabs have been prescribed.

## 2023-04-29 ENCOUNTER — Encounter (HOSPITAL_COMMUNITY): Payer: Self-pay | Admitting: General Surgery

## 2023-04-29 NOTE — Anesthesia Preprocedure Evaluation (Signed)
Anesthesia Evaluation  Patient identified by MRN, date of birth, ID band Patient awake    Reviewed: Allergy & Precautions, H&P , NPO status , Patient's Chart, lab work & pertinent test results, reviewed documented beta blocker date and time   Airway Mallampati: II  TM Distance: >3 FB Neck ROM: full    Dental no notable dental hx. (+) Dental Advisory Given, Teeth Intact   Pulmonary Current Smoker   Pulmonary exam normal breath sounds clear to auscultation       Cardiovascular hypertension, Normal cardiovascular exam Rhythm:Regular Rate:Normal     Neuro/Psych negative neurological ROS  negative psych ROS   GI/Hepatic negative GI ROS, Neg liver ROS,,,  Endo/Other  diabetes    Renal/GU negative Renal ROS  negative genitourinary   Musculoskeletal   Abdominal   Peds  Hematology negative hematology ROS (+)   Anesthesia Other Findings   Reproductive/Obstetrics negative OB ROS                             Anesthesia Physical Anesthesia Plan  ASA: 2  Anesthesia Plan: General   Post-op Pain Management:    Induction:   PONV Risk Score and Plan:   Airway Management Planned: Fiberoptic Intubation Planned and Awake Intubation Planned  Additional Equipment: None  Intra-op Plan:   Post-operative Plan:   Informed Consent: I have reviewed the patients History and Physical, chart, labs and discussed the procedure including the risks, benefits and alternatives for the proposed anesthesia with the patient or authorized representative who has indicated his/her understanding and acceptance.     Dental Advisory Given  Plan Discussed with: CRNA  Anesthesia Plan Comments:         Anesthesia Quick Evaluation

## 2023-04-30 ENCOUNTER — Ambulatory Visit (HOSPITAL_COMMUNITY)
Admission: RE | Admit: 2023-04-30 | Discharge: 2023-04-30 | Disposition: A | Discharge status: Home / Self Care | Payer: Medicare Other | Attending: General Surgery | Admitting: General Surgery

## 2023-04-30 ENCOUNTER — Encounter (HOSPITAL_COMMUNITY)
Admission: RE | Disposition: A | Discharge status: Home / Self Care | Payer: Self-pay | Source: Home / Self Care | Attending: General Surgery

## 2023-04-30 ENCOUNTER — Encounter (HOSPITAL_COMMUNITY): Payer: Self-pay | Admitting: General Surgery

## 2023-04-30 ENCOUNTER — Other Ambulatory Visit: Payer: Self-pay

## 2023-04-30 ENCOUNTER — Ambulatory Visit (HOSPITAL_COMMUNITY): Payer: Medicare Other | Admitting: Anesthesiology

## 2023-04-30 ENCOUNTER — Ambulatory Visit (HOSPITAL_BASED_OUTPATIENT_CLINIC_OR_DEPARTMENT_OTHER): Payer: Medicare Other | Admitting: Anesthesiology

## 2023-04-30 DIAGNOSIS — I10 Essential (primary) hypertension: Secondary | ICD-10-CM | POA: Insufficient documentation

## 2023-04-30 DIAGNOSIS — E1165 Type 2 diabetes mellitus with hyperglycemia: Secondary | ICD-10-CM

## 2023-04-30 DIAGNOSIS — K5730 Diverticulosis of large intestine without perforation or abscess without bleeding: Secondary | ICD-10-CM | POA: Diagnosis not present

## 2023-04-30 DIAGNOSIS — Z7984 Long term (current) use of oral hypoglycemic drugs: Secondary | ICD-10-CM | POA: Diagnosis not present

## 2023-04-30 DIAGNOSIS — E119 Type 2 diabetes mellitus without complications: Secondary | ICD-10-CM | POA: Diagnosis not present

## 2023-04-30 DIAGNOSIS — Z1211 Encounter for screening for malignant neoplasm of colon: Secondary | ICD-10-CM | POA: Diagnosis not present

## 2023-04-30 DIAGNOSIS — F17290 Nicotine dependence, other tobacco product, uncomplicated: Secondary | ICD-10-CM | POA: Insufficient documentation

## 2023-04-30 HISTORY — PX: COLONOSCOPY WITH PROPOFOL: SHX5780

## 2023-04-30 LAB — GLUCOSE, CAPILLARY: Glucose-Capillary: 197 mg/dL — ABNORMAL HIGH (ref 70–99)

## 2023-04-30 SURGERY — COLONOSCOPY WITH PROPOFOL
Anesthesia: General

## 2023-04-30 MED ORDER — LIDOCAINE HCL (CARDIAC) PF 100 MG/5ML IV SOSY
PREFILLED_SYRINGE | INTRAVENOUS | Status: DC | PRN
  Administered 2023-04-30: 80 mg via INTRAVENOUS

## 2023-04-30 MED ORDER — PROPOFOL 1000 MG/100ML IV EMUL
INTRAVENOUS | Status: AC
  Filled 2023-04-30: qty 100

## 2023-04-30 MED ORDER — PROPOFOL 10 MG/ML IV BOLUS
INTRAVENOUS | Status: DC | PRN
  Administered 2023-04-30: 80 mg via INTRAVENOUS

## 2023-04-30 MED ORDER — LACTATED RINGERS IV SOLN: INTRAVENOUS | Status: DC | PRN

## 2023-04-30 MED ORDER — PROPOFOL 500 MG/50ML IV EMUL
INTRAVENOUS | Status: DC | PRN
  Administered 2023-04-30: 150 ug/kg/min via INTRAVENOUS

## 2023-04-30 NOTE — Anesthesia Postprocedure Evaluation (Signed)
Anesthesia Post Note  Patient: Marie Bell  Procedure(s) Performed: COLONOSCOPY WITH PROPOFOL  Patient location during evaluation: PACU Anesthesia Type: General Level of consciousness: awake and alert Pain management: pain level controlled Vital Signs Assessment: post-procedure vital signs reviewed and stable Respiratory status: spontaneous breathing, nonlabored ventilation, respiratory function stable and patient connected to nasal cannula oxygen Cardiovascular status: blood pressure returned to baseline and stable Postop Assessment: no apparent nausea or vomiting Anesthetic complications: no   There were no known notable events for this encounter.   Last Vitals:  Vitals:   04/30/23 0747 04/30/23 0751  BP: (!) 96/54 128/61  Pulse: 69 67  Resp: 20 19  Temp: 36.8 C   SpO2: 98% 96%    Last Pain:  Vitals:   04/30/23 0747  TempSrc: Oral  PainSc: 0-No pain                 Olaoluwa Grieder L Luismario Coston

## 2023-04-30 NOTE — Op Note (Signed)
[facility] Patient Name: Marie Bell Procedure Date: 04/30/2023 7:09 AM MRN: [id number] Date of Birth: [birth year] Attending MD: Franky Macho , MD, [id number] CSN: [id number] Age: 67 Admit Type: Outpatient Procedure:                Colonoscopy Indications:              Screening for colorectal malignant neoplasm Providers:                Franky Macho, MD, Nena Polio, RN, Zena Amos Referring MD:              Medicines:                Propofol per Anesthesia Complications:            No immediate complications. Estimated Blood Loss:     Estimated blood loss: none. Procedure:                Pre-Anesthesia Assessment:                           - Prior to the procedure, a History and Physical                            was performed, and patient medications and                            allergies were reviewed. The patient is competent.                            The risks and benefits of the procedure and the                            sedation options and risks were discussed with the                            patient. All questions were answered and informed                            consent was obtained. Patient identification and                            proposed procedure were verified by the physician,                            the nurse, the anesthesiologist, the anesthetist                            and the technician in the endoscopy suite. Mental                            Status Examination: alert and oriented. Airway                            Examination: normal oropharyngeal airway and neck  mobility. Respiratory Examination: clear to                            auscultation. CV Examination: RRR, no murmurs, no                            S3 or S4. Prophylactic Antibiotics: The patient                            does not require prophylactic antibiotics. Prior                            Anticoagulants: The patient has taken no                             anticoagulant or antiplatelet agents. ASA Grade                            Assessment: II - A patient with mild systemic                            disease. After reviewing the risks and benefits,                            the patient was deemed in satisfactory condition to                            undergo the procedure. The anesthesia plan was to                            use deep sedation / analgesia. Immediately prior to                            administration of medications, the patient was                            re-assessed for adequacy to receive sedatives. The                            heart rate, respiratory rate, oxygen saturations,                            blood pressure, adequacy of pulmonary ventilation,                            and response to care were monitored throughout the                            procedure. The physical status of the patient was                            re-assessed after the procedure.  After obtaining informed consent, the colonoscope                            was passed under direct vision. Throughout the                            procedure, the patient's blood pressure, pulse, and                            oxygen saturations were monitored continuously. The                            [id number]) scope was introduced through the                            anus and advanced to the the cecum, identified by                            the appendiceal orifice, ileocecal valve and                            palpation. No anatomical landmarks were                            photographed. The entire colon was well visualized.                            The colonoscopy was performed without difficulty.                            The patient tolerated the procedure well. The                            quality of the bowel preparation was adequate. The                            total  duration of the procedure was 10 minutes. Scope In: 7:31:40 AM Scope Out: 7:41:47 AM Scope Withdrawal Time: 0 hours 5 minutes 2 seconds  Total Procedure Duration: 0 hours 10 minutes 7 seconds  Findings:      A few small-mouthed diverticula were found in the sigmoid colon.       Estimated blood loss: none.      The exam was otherwise without abnormality on direct and retroflexion       views.      The perianal and digital rectal examinations were normal. Impression:               - Diverticulosis in the sigmoid colon.                           - The examination was otherwise normal on direct                            and retroflexion views.                           -  No specimens collected. Moderate Sedation:      Moderate (conscious) sedation was administered by the nurse and       supervised by the endoscopist. The patient's oxygen saturation, heart       rate, blood pressure and response to care were monitored. Recommendation:           - Written discharge instructions were provided to                            the patient.                           - The signs and symptoms of potential delayed                            complications were discussed with the patient.                           - Patient has a contact number available for                            emergencies.                           - Return to normal activities tomorrow.                           - Resume previous diet.                           - Continue present medications.                           - Repeat colonoscopy in 10 years for screening                            purposes. Procedure Code(s):        --- Professional ---                           [phone], Colonoscopy, flexible; diagnostic, including                            collection of specimen(s) by brushing or washing,                            when performed (separate procedure) Diagnosis Code(s):        --- Professional ---                            Z12.11, Encounter for screening for malignant                            neoplasm of colon                           K57.30, Diverticulosis of large intestine without  perforation or abscess without bleeding CPT copyright 2022 [employer]. All rights reserved. The codes documented in this report are preliminary and upon coder review may  be revised to meet current compliance requirements. Franky Macho, MD Franky Macho, MD 04/30/2023 7:46:16 AM This report has been signed electronically. Number of Addenda: 0

## 2023-04-30 NOTE — Interval H&P Note (Signed)
History and Physical Interval Note:  04/30/2023 7:22 AM  Marie Bell  has presented today for surgery, with the diagnosis of SCREENING FOR COLON CANCER.  The various methods of treatment have been discussed with the patient and family. After consideration of risks, benefits and other options for treatment, the patient has consented to  Procedure(s): COLONOSCOPY WITH PROPOFOL (N/A) as a surgical intervention.  The patient's history has been reviewed, patient examined, no change in status, stable for surgery.  I have reviewed the patient's chart and labs.  Questions were answered to the patient's satisfaction.     Franky Macho

## 2023-04-30 NOTE — Transfer of Care (Addendum)
Immediate Anesthesia Transfer of Care Note  Patient: Marie Bell  Procedure(s) Performed: COLONOSCOPY WITH PROPOFOL  Patient Location: Endoscopy Unit  Anesthesia Type:General  Level of Consciousness: drowsy and patient cooperative  Airway & Oxygen Therapy: Patient Spontanous Breathing and Patient connected to nasal cannula oxygen  Post-op Assessment: Report given to RN and Post -op Vital signs reviewed and stable  Post vital signs: Reviewed and stable  Last Vitals:  Vitals Value Taken Time  BP 96/54 04/30/23   0747  Temp 36.8 04/30/23   0747  Pulse 69 04/30/23   0747  Resp 20 04/30/23   0747  SpO2 98% 04/30/23   0747    Last Pain:  Vitals:   04/30/23 0651  TempSrc: Oral  PainSc: 0-No pain      Patients Stated Pain Goal: 8 (04/30/23 0651)  Complications: No notable events documented.

## 2023-05-03 DIAGNOSIS — H34831 Tributary (branch) retinal vein occlusion, right eye, with macular edema: Secondary | ICD-10-CM | POA: Diagnosis not present

## 2023-05-08 ENCOUNTER — Encounter (HOSPITAL_COMMUNITY): Payer: Self-pay | Admitting: General Surgery

## 2023-05-21 DIAGNOSIS — E1165 Type 2 diabetes mellitus with hyperglycemia: Secondary | ICD-10-CM | POA: Diagnosis not present

## 2023-05-22 LAB — COMPREHENSIVE METABOLIC PANEL
ALT: 16 [IU]/L (ref 0–32)
AST: 14 [IU]/L (ref 0–40)
Albumin: 4.4 g/dL (ref 3.9–4.9)
Alkaline Phosphatase: 50 [IU]/L (ref 44–121)
BUN/Creatinine Ratio: 17 (ref 12–28)
BUN: 13 mg/dL (ref 8–27)
Bilirubin Total: 0.3 mg/dL (ref 0.0–1.2)
CO2: 22 mmol/L (ref 20–29)
Calcium: 9.7 mg/dL (ref 8.7–10.3)
Chloride: 102 mmol/L (ref 96–106)
Creatinine, Ser: 0.76 mg/dL (ref 0.57–1.00)
Globulin, Total: 2.3 g/dL (ref 1.5–4.5)
Glucose: 200 mg/dL — ABNORMAL HIGH (ref 70–99)
Potassium: 4.2 mmol/L (ref 3.5–5.2)
Sodium: 139 mmol/L (ref 134–144)
Total Protein: 6.7 g/dL (ref 6.0–8.5)
eGFR: 87 mL/min/{1.73_m2} (ref >59)

## 2023-05-22 LAB — LIPID PANEL
Chol/HDL Ratio: 4.1 {ratio} (ref 0.0–4.4)
Cholesterol, Total: 169 mg/dL (ref 100–199)
HDL: 41 mg/dL (ref >39)
LDL Chol Calc (NIH): 98 mg/dL (ref 0–99)
Triglycerides: 173 mg/dL — ABNORMAL HIGH (ref 0–149)
VLDL Cholesterol Cal: 30 mg/dL (ref 5–40)

## 2023-05-22 LAB — TSH: TSH: 2.67 u[IU]/mL (ref 0.450–4.500)

## 2023-05-22 LAB — T4, FREE: Free T4: 1.35 ng/dL (ref 0.82–1.77)

## 2023-05-27 ENCOUNTER — Encounter: Payer: Self-pay | Admitting: "Endocrinology

## 2023-05-27 ENCOUNTER — Ambulatory Visit: Payer: Medicare Other | Admitting: "Endocrinology

## 2023-05-27 VITALS — BP 138/72 | HR 76 | Ht 67.0 in | Wt 168.4 lb

## 2023-05-27 DIAGNOSIS — E782 Mixed hyperlipidemia: Secondary | ICD-10-CM | POA: Diagnosis not present

## 2023-05-27 DIAGNOSIS — Z7984 Long term (current) use of oral hypoglycemic drugs: Secondary | ICD-10-CM | POA: Diagnosis not present

## 2023-05-27 DIAGNOSIS — E1165 Type 2 diabetes mellitus with hyperglycemia: Secondary | ICD-10-CM | POA: Diagnosis not present

## 2023-05-27 DIAGNOSIS — I10 Essential (primary) hypertension: Secondary | ICD-10-CM | POA: Diagnosis not present

## 2023-05-27 LAB — POCT GLYCOSYLATED HEMOGLOBIN (HGB A1C): HbA1c, POC (controlled diabetic range): 8 % — AB (ref 0.0–7.0)

## 2023-05-27 MED ORDER — TIRZEPATIDE 2.5 MG/0.5ML ~~LOC~~ SOAJ
2.5000 mg | SUBCUTANEOUS | 0 refills | Status: DC
Start: 2023-05-27 — End: 2023-06-26

## 2023-05-27 NOTE — Progress Notes (Signed)
05/27/2023, 12:42 PM  Endocrinology follow-up note   Subjective:    Patient ID: Marie Bell, female    DOB: April 19, 1956.  Marie Bell is being seen in follow-up after she was seen in consultation for management of currently uncontrolled symptomatic diabetes requested by  Assunta Found, MD.   Past Medical History:  Diagnosis Date   DM (diabetes mellitus) (HCC)    Hyperlipidemia    Hypertension     Past Surgical History:  Procedure Laterality Date   ABDOMINAL HYSTERECTOMY     CHOLECYSTECTOMY     COLONOSCOPY WITH PROPOFOL N/A 04/30/2023   Procedure: COLONOSCOPY WITH PROPOFOL;  Surgeon: Franky Macho, MD;  Location: [facility];  Service: Gastroenterology;  Laterality: N/A;    Social History   Socioeconomic History   Marital status: Married    Spouse name: Not on file   Number of children: 2   Years of education: Not on file   Highest education level: Not on file  Occupational History   Occupation: HR  Tobacco Use   Smoking status: Every Day    Types: E-cigarettes    Last attempt to quit: 06/18/2006    Years since quitting: 16.9   Smokeless tobacco: Never  Vaping Use   Vaping status: Former  Substance and Sexual Activity   Alcohol use: No   Drug use: No   Sexual activity: Not on file  Other Topics Concern   Not on file  Social History Narrative   Not on file   Social Determinants of Health   Financial Resource Strain: Not on file  Food Insecurity: Not on file  Transportation Needs: Not on file  Physical Activity: Not on file  Stress: Not on file  Social Connections: Not on file    Family History  Problem Relation Age of Onset   Hypertension Mother    Arthritis Mother    Liver disease Father    Alcoholism Father    Diabetes Maternal Grandmother    Colon cancer Maternal Grandfather     Outpatient Encounter Medications as of 05/27/2023  Medication Sig    tirzepatide (MOUNJARO) 2.5 MG/0.5ML Pen Inject 2.5 mg into the skin once a week.   amLODipine (NORVASC) 5 MG tablet Take 5 mg by mouth daily.   atorvastatin (LIPITOR) 40 MG tablet Take 1 tablet (40 mg total) by mouth daily.   bisoprolol-hydrochlorothiazide (ZIAC) 2.5-6.25 MG tablet Take 1 tablet by mouth daily.   fenofibrate 160 MG tablet Take 1 tablet (160 mg total) by mouth daily.   glipiZIDE (GLUCOTROL XL) 5 MG 24 hr tablet Take 5 mg by mouth daily.   losartan (COZAAR) 50 MG tablet Take 100 mg by mouth daily.   metFORMIN (GLUCOPHAGE) 1000 MG tablet Take 1 tablet (1,000 mg total) by mouth 2 (two) times daily with a meal.   OVER THE COUNTER MEDICATION CBD gummie daily   No facility-administered encounter medications on file as of 05/27/2023.    ALLERGIES: Allergies  Allergen Reactions   Simvastatin Other (See Comments)    Leg cramps   Jardiance [Empagliflozin]    Penicillins    Augmentin [  Amoxicillin-Pot Clavulanate] Rash   Cyclobenzaprine Palpitations    Body aches,nausea,weakness   Sulfa Antibiotics Rash    VACCINATION STATUS:  There is no immunization history on file for this patient.  Diabetes She presents for her follow-up diabetic visit. She has type 2 diabetes mellitus. Onset time: She was diagnosed at approximate age of 52 years. Her disease course has been worsening. There are no hypoglycemic associated symptoms. Pertinent negatives for hypoglycemia include no confusion, headaches, pallor or seizures. Associated symptoms include fatigue. Pertinent negatives for diabetes include no chest pain, no polydipsia, no polyphagia and no polyuria. There are no hypoglycemic complications. Symptoms are worsening. Diabetic complications include a CVA. Risk factors for coronary artery disease include dyslipidemia, hypertension, sedentary lifestyle and tobacco exposure. Current diabetic treatments: She is currently taking metformin 1000 mg p.o. twice daily, glipizide 5 mg p.o. daily at  breakfast.  She did not tolerate Jardiance which she has after few doses. Her weight is stable. She is following a generally unhealthy diet. When asked about meal planning, she reported none. She rarely participates in exercise. Her home blood glucose trend is increasing steadily. Her overall blood glucose range is 180-200 mg/dl. (She presents without a meter nor logs.  Her point-of-care A1c is increasing to 8%.  She did not document or report hypoglycemia. ) An ACE inhibitor/angiotensin II receptor blocker is being taken.  Hyperlipidemia This is a chronic problem. The problem is uncontrolled. Exacerbating diseases include diabetes. Pertinent negatives include no chest pain, myalgias or shortness of breath. Current antihyperlipidemic treatment includes statins and fibric acid derivatives. Risk factors for coronary artery disease include diabetes mellitus, dyslipidemia, family history, hypertension, a sedentary lifestyle and post-menopausal.  Hypertension This is a chronic problem. Pertinent negatives include no chest pain, headaches, palpitations or shortness of breath. Risk factors for coronary artery disease include dyslipidemia, diabetes mellitus, sedentary lifestyle, smoking/tobacco exposure and post-menopausal state. Past treatments include angiotensin blockers, beta blockers and calcium channel blockers. Hypertensive end-organ damage includes CVA.     Review of Systems  Constitutional:  Positive for fatigue. Negative for chills, fever and unexpected weight change.  HENT:  Negative for trouble swallowing and voice change.   Eyes:  Negative for visual disturbance.  Respiratory:  Negative for cough, shortness of breath and wheezing.   Cardiovascular:  Negative for chest pain, palpitations and leg swelling.  Gastrointestinal:  Negative for diarrhea, nausea and vomiting.  Endocrine: Negative for cold intolerance, heat intolerance, polydipsia, polyphagia and polyuria.  Musculoskeletal:  Negative for  arthralgias and myalgias.  Skin:  Negative for color change, pallor, rash and wound.  Neurological:  Negative for seizures and headaches.  Psychiatric/Behavioral:  Negative for confusion and suicidal ideas.     Objective:       05/27/2023   10:27 AM 05/27/2023    9:51 AM 04/30/2023    7:51 AM  Vitals with BMI  Height  5\' 7"    Weight  168 lbs 6 oz   BMI  26.37   Systolic 138 152 161  Diastolic 72 84 61  Pulse  76 67    BP 138/72 Comment: L arm with manual cuff. Dr.Yalexa Blust made aware.  Pulse 76   Ht 5\' 7"  (1.702 m)   Wt 168 lb 6.4 oz (76.4 kg)   BMI 26.38 kg/m   Wt Readings from Last 3 Encounters:  05/27/23 168 lb 6.4 oz (76.4 kg)  04/30/23 168 lb (76.2 kg)  04/04/23 172 lb (78 kg)      CMP ( most recent)  CMP     Component Value Date/Time   NA 139 05/21/2023 0809   K 4.2 05/21/2023 0809   CL 102 05/21/2023 0809   CO2 22 05/21/2023 0809   GLUCOSE 200 (H) 05/21/2023 0809   GLUCOSE 139 (H) 11/07/2020 1016   BUN 13 05/21/2023 0809   CREATININE 0.76 05/21/2023 0809   CALCIUM 9.7 05/21/2023 0809   PROT 6.7 05/21/2023 0809   ALBUMIN 4.4 05/21/2023 0809   AST 14 05/21/2023 0809   ALT 16 05/21/2023 0809   ALKPHOS 50 05/21/2023 0809   BILITOT 0.3 05/21/2023 0809   GFRNONAA >60 11/07/2020 1016     Diabetic Labs (most recent): Lab Results  Component Value Date   HGBA1C 8.0 (A) 05/27/2023   HGBA1C 7.8 (A) 02/06/2023   HGBA1C 7.6 (A) 10/29/2022     Lipid Panel ( most recent) Lipid Panel     Component Value Date/Time   CHOL 169 05/21/2023 0809   TRIG 173 (H) 05/21/2023 0809   HDL 41 05/21/2023 0809   CHOLHDL 4.1 05/21/2023 0809   CHOLHDL 6.4 11/08/2020 0518   VLDL UNABLE TO CALCULATE IF TRIGLYCERIDE OVER 400 mg/dL 60/45/4098 1191   LDLCALC 98 05/21/2023 0809   LDLDIRECT 82.7 11/08/2020 0518   LABVLDL 30 05/21/2023 0809      Lab Results  Component Value Date   TSH 2.670 05/21/2023   TSH 2.530 10/19/2022   TSH 1.64 07/18/2022   TSH 3.150 01/21/2017    FREET4 1.35 05/21/2023   FREET4 1.19 10/19/2022       Assessment & Plan:   1. Type 2 diabetes mellitus with hyperglycemia, without long-term current use of insulin (HCC)   - Marie Bell has currently uncontrolled symptomatic type 2 DM since  67 years of age.   She presents without a meter nor logs.  Her point-of-care A1c is increasing to 8%.  She did not document or report hypoglycemia.   Recent labs reviewed. - I had a long discussion with her about the possible risk factors and  the pathology behind its diabetes and its complications. -her diabetes is complicated by CVA, comorbid hyperlipidemia and hypertension, history of smoking and she remains at a high risk for more acute and chronic complications which include CAD, CVA, CKD, retinopathy, and neuropathy. These are all discussed in detail with her.  - I discussed all available options of managing her diabetes including de-escalation of medications. I have counseled her on Food as Medicine by adopting a Whole Food , Plant Predominant  ( WFPP) nutrition as recommended by [employer] of Lifestyle Medicine. Patient is encouraged to switch to  unprocessed or minimally processed  complex starch, adequate protein intake (mainly plant source), minimal liquid fat, plenty of fruits, and vegetables. -  she is advised to stick to a routine mealtimes to eat 3 complete meals a day and snack only when necessary ( to snack only to correct hypoglycemia BG <70 day time or <100 at night).   - she acknowledges that there is a room for improvement in her food and drink choices. - Suggestion is made for her to avoid simple carbohydrates  from her diet including Cakes, Sweet Desserts, Ice Cream, Soda (diet and regular), Sweet Tea, Candies, Chips, Cookies, Store Bought Juices, Alcohol , Artificial Sweeteners,  Coffee Creamer, and "Sugar-free" Products, Lemonade. This will help patient to have more stable blood glucose profile and potentially avoid  unintended weight gain.  The following Lifestyle Medicine recommendations according to [facility] of Lifestyle Medicine  (  ACLM) were discussed and and offered to patient and she  agrees to start the journey:  A. Whole Foods, Plant-Based Nutrition comprising of fruits and vegetables, plant-based proteins, whole-grain carbohydrates was discussed in detail with the patient.   A list for source of those nutrients were also provided to the patient.  Patient will use only water or unsweetened tea for hydration. B.  The need to stay away from risky substances including alcohol, smoking; obtaining 7 to 9 hours of restorative sleep, at least 150 minutes of moderate intensity exercise weekly, the importance of healthy social connections,  and stress management techniques were discussed. C.  A full color page of  Calorie density of various food groups per pound showing examples of each food groups was provided to the patient.    - she will be scheduled with Norm Salt, RDN, CDE for individualized diabetes education.  - I have approached her with the following individualized plan to manage  her diabetes and patient agrees:   -Her engagement with lifestyle medicine is not optimal yet.  She has not tolerating the Jardiance which she has stopped after only a few doses.   -I discussed and offered her GLP-1 receptor agonists and this time she is open for that option.  I discussed and prescribed Mounjaro 2.5 mg subcutaneously weekly.  This medication will be advanced as she tolerates.  Side effects and precautions discussed with her.  -In light of her previous history of TIA,  GLP-1 receptor agonist is preferred over SGLT2 inhibitors as an additional treatment.    -She is advised to continue metformin 1000 mg p.o. twice daily.  She is advised to continue glipizide 5 mg XL daily at breakfast.  If she tolerates and responds to [facility], she will be taken off of glipizide on subsequent visits.    -  Specific targets for  A1c;  LDL, HDL,  and Triglycerides were discussed with the patient.  2) Blood Pressure /Hypertension:   -Her blood pressure is controlled to near target.   she is advised to continue her current medications including amlodipine 5 mg p.o. daily, bisoprolol-hydrochlorothiazide 2.5-6.25 mg p.o. twice daily, losartan 50 mg p.o. daily.    3) Lipids/Hyperlipidemia:   Review of her recent lipid panel showed LDL at 98, significantly above target.  She has mixed hyperlipidemia.  She is advised to continue  atorvastatin 40 mg p.o. nightly, fenofibrate 160 mg p.o. daily.  Whole food plant-based diet discussed above will address hyperlipidemia as well.  4)  Weight/Diet:  Body mass index is 26.38 kg/m.  -     she is  a candidate for weight loss. I discussed with her the fact that loss of 5 - 10% of her  current body weight will have the most impact on her diabetes management.  The above detailed  ACLM recommendations for nutrition, exercise, sleep, social life, avoidance of risky substances, the need for restorative sleep   information will also detailed on discharge instructions.  5) Chronic Care/Health Maintenance:  -she  is on ACEI/ARB and Statin medications and  is encouraged to initiate and continue to follow up with Ophthalmology, Dentist,  Podiatrist at least yearly or according to recommendations, and advised to   stay away from smoking. I have recommended yearly flu vaccine and pneumonia vaccine at least every 5 years; moderate intensity exercise for up to 150 minutes weekly; and  sleep for 7- 9 hours a day.  - she is  advised to maintain close follow up with  Assunta Found, MD for primary care needs, as well as her other providers for optimal and coordinated care.   I spent  26  minutes in the care of the patient today including review of labs from CMP, Lipids, Thyroid Function, Hematology (current and previous including abstractions from other facilities); face-to-face time  discussing  her blood glucose readings/logs, discussing hypoglycemia and hyperglycemia episodes and symptoms, medications doses, her options of short and long term treatment based on the latest standards of care / guidelines;  discussion about incorporating lifestyle medicine;  and documenting the encounter. Risk reduction counseling performed per USPSTF guidelines to reduce  cardiovascular risk factors.     Please refer to Patient Instructions for Blood Glucose Monitoring and Insulin/Medications Dosing Guide"  in media tab for additional information. Please  also refer to " Patient Self Inventory" in the Media  tab for reviewed elements of pertinent patient history.  Marie Bell participated in the discussions, expressed understanding, and voiced agreement with the above plans.  All questions were answered to her satisfaction. she is encouraged to contact clinic should she have any questions or concerns prior to her return visit.     Follow up plan: - Return in about 3 months (around 08/25/2023) for Bring Meter/CGM Device/Logs- A1c in Office.  Marquis Lunch, MD [facility] [address] [place], [place] [address] Phone: [phone]  Fax: [phone]    05/27/2023, 12:42 PM  This note was partially dictated with voice recognition software. Similar sounding words can be transcribed inadequately or may not  be corrected upon review.

## 2023-05-27 NOTE — Patient Instructions (Signed)

## 2023-06-08 ENCOUNTER — Other Ambulatory Visit: Payer: Self-pay | Admitting: "Endocrinology

## 2023-06-17 ENCOUNTER — Telehealth: Payer: Self-pay | Admitting: "Endocrinology

## 2023-06-17 NOTE — Telephone Encounter (Signed)
Pt stopped by and wants you to call her in regards to her [facility]

## 2023-06-18 NOTE — Telephone Encounter (Signed)
Have her stop the Glipizide now that the Greggory Keen is in her system.  That will help prevent low readings.

## 2023-06-18 NOTE — Telephone Encounter (Signed)
 Pt states she has been taking Mounjaro  2.5mg  for the past 3 weeks and takes her 4th injection tomorrow. States her BG readings recently have been no higher than 140 and yesterday her FBG was in the 90's and BG before bed was 85 which concerned her. States her FBG this morning was 119. Pt is taking Mounjaro  2.5mg  weekly, glipizide  2.5mg  daily and metformin  1000mg  bid.

## 2023-06-18 NOTE — Telephone Encounter (Signed)
Spoke with pt advising her to discontinue glipizide per Whitney Reardon,FNP. Discussed with her this would help to prevent low readings. Pt voiced understanding.

## 2023-06-26 ENCOUNTER — Other Ambulatory Visit: Payer: Self-pay

## 2023-06-26 DIAGNOSIS — I10 Essential (primary) hypertension: Secondary | ICD-10-CM | POA: Diagnosis not present

## 2023-06-26 DIAGNOSIS — I639 Cerebral infarction, unspecified: Secondary | ICD-10-CM | POA: Diagnosis not present

## 2023-06-26 DIAGNOSIS — D72829 Elevated white blood cell count, unspecified: Secondary | ICD-10-CM | POA: Diagnosis not present

## 2023-06-26 DIAGNOSIS — Z6826 Body mass index (BMI) 26.0-26.9, adult: Secondary | ICD-10-CM | POA: Diagnosis not present

## 2023-06-26 DIAGNOSIS — E782 Mixed hyperlipidemia: Secondary | ICD-10-CM | POA: Diagnosis not present

## 2023-06-26 DIAGNOSIS — K080 Exfoliation of teeth due to systemic causes: Secondary | ICD-10-CM | POA: Diagnosis not present

## 2023-06-26 DIAGNOSIS — B354 Tinea corporis: Secondary | ICD-10-CM | POA: Diagnosis not present

## 2023-06-26 DIAGNOSIS — E1140 Type 2 diabetes mellitus with diabetic neuropathy, unspecified: Secondary | ICD-10-CM | POA: Diagnosis not present

## 2023-06-26 DIAGNOSIS — E663 Overweight: Secondary | ICD-10-CM | POA: Diagnosis not present

## 2023-06-26 MED ORDER — TIRZEPATIDE 2.5 MG/0.5ML ~~LOC~~ SOAJ: 2.5000 mg | SUBCUTANEOUS | 0 refills | Status: DC

## 2023-06-26 NOTE — Telephone Encounter (Signed)
 Pt is calling to see if the mounjaro is going to be called in

## 2023-06-26 NOTE — Telephone Encounter (Signed)
 Rx sent

## 2023-08-08 ENCOUNTER — Other Ambulatory Visit: Payer: Self-pay | Admitting: "Endocrinology

## 2023-08-28 ENCOUNTER — Ambulatory Visit: Payer: Medicare Other | Admitting: "Endocrinology

## 2023-09-04 DIAGNOSIS — M62830 Muscle spasm of back: Secondary | ICD-10-CM | POA: Diagnosis not present

## 2023-09-07 ENCOUNTER — Other Ambulatory Visit: Payer: Self-pay | Admitting: "Endocrinology

## 2023-09-20 ENCOUNTER — Encounter: Payer: Self-pay | Admitting: "Endocrinology

## 2023-09-20 ENCOUNTER — Ambulatory Visit: Admitting: "Endocrinology

## 2023-09-20 VITALS — BP 126/76 | HR 72 | Ht 67.0 in | Wt 169.2 lb

## 2023-09-20 DIAGNOSIS — E1165 Type 2 diabetes mellitus with hyperglycemia: Secondary | ICD-10-CM

## 2023-09-20 DIAGNOSIS — Z7985 Long-term (current) use of injectable non-insulin antidiabetic drugs: Secondary | ICD-10-CM

## 2023-09-20 DIAGNOSIS — I10 Essential (primary) hypertension: Secondary | ICD-10-CM

## 2023-09-20 DIAGNOSIS — E782 Mixed hyperlipidemia: Secondary | ICD-10-CM | POA: Diagnosis not present

## 2023-09-20 DIAGNOSIS — Z7984 Long term (current) use of oral hypoglycemic drugs: Secondary | ICD-10-CM

## 2023-09-20 LAB — POCT GLYCOSYLATED HEMOGLOBIN (HGB A1C): HbA1c, POC (controlled diabetic range): 6.8 % (ref 0.0–7.0)

## 2023-09-20 MED ORDER — TIRZEPATIDE 5 MG/0.5ML ~~LOC~~ SOAJ: 5.0000 mg | SUBCUTANEOUS | 1 refills | Status: DC

## 2023-09-20 NOTE — Patient Instructions (Signed)

## 2023-09-20 NOTE — Progress Notes (Signed)
 09/20/2023, 9:08 AM  Endocrinology follow-up note   Subjective:    Patient ID: Marie Bell, female    DOB: Feb 18, 1956.  Marie Bell is being seen in follow-up after she was seen in consultation for management of currently uncontrolled symptomatic diabetes requested by  Assunta Found, MD.   Past Medical History:  Diagnosis Date   DM (diabetes mellitus) (HCC)    Hyperlipidemia    Hypertension     Past Surgical History:  Procedure Laterality Date   ABDOMINAL HYSTERECTOMY     CHOLECYSTECTOMY     COLONOSCOPY WITH PROPOFOL N/A 04/30/2023   Procedure: COLONOSCOPY WITH PROPOFOL;  Surgeon: Franky Macho, MD;  Location: [facility];  Service: Gastroenterology;  Laterality: N/A;    Social History   Socioeconomic History   Marital status: Married    Spouse name: Not on file   Number of children: 2   Years of education: Not on file   Highest education level: Not on file  Occupational History   Occupation: HR  Tobacco Use   Smoking status: Former    Types: E-cigarettes    Quit date: 08/2023    Years since quitting: 0.0   Smokeless tobacco: Never  Vaping Use   Vaping status: Former  Substance and Sexual Activity   Alcohol use: No   Drug use: No   Sexual activity: Not on file  Other Topics Concern   Not on file  Social History Narrative   Not on file   Social Drivers of Health   Financial Resource Strain: Not on file  Food Insecurity: Not on file  Transportation Needs: Not on file  Physical Activity: Not on file  Stress: Not on file  Social Connections: Not on file    Family History  Problem Relation Age of Onset   Hypertension Mother    Arthritis Mother    Liver disease Father    Alcoholism Father    Diabetes Maternal Grandmother    Colon cancer Maternal Grandfather     Outpatient Encounter Medications as of 09/20/2023  Medication Sig   tirzepatide (MOUNJARO) 5  MG/0.5ML Pen Inject 5 mg into the skin once a week.   amLODipine (NORVASC) 5 MG tablet Take 5 mg by mouth daily.   atorvastatin (LIPITOR) 40 MG tablet TAKE 1 TABLET(40 MG) BY MOUTH DAILY   bisoprolol-hydrochlorothiazide (ZIAC) 2.5-6.25 MG tablet Take 1 tablet by mouth daily.   fenofibrate 160 MG tablet Take 1 tablet (160 mg total) by mouth daily.   losartan (COZAAR) 50 MG tablet Take 100 mg by mouth daily.   metFORMIN (GLUCOPHAGE) 1000 MG tablet TAKE 1 TABLET(1000 MG) BY MOUTH TWICE DAILY WITH A MEAL   OVER THE COUNTER MEDICATION CBD gummie daily   [DISCONTINUED] glipiZIDE (GLUCOTROL XL) 5 MG 24 hr tablet TAKE 1 TABLET(5 MG) BY MOUTH DAILY WITH BREAKFAST   [DISCONTINUED] tirzepatide (MOUNJARO) 2.5 MG/0.5ML Pen Inject 2.5 mg into the skin once a week.   No facility-administered encounter medications on file as of 09/20/2023.    ALLERGIES: Allergies  Allergen Reactions   Simvastatin Other (See Comments)    Leg cramps  Jardiance [Empagliflozin]    Penicillins    Augmentin [Amoxicillin-Pot Clavulanate] Rash   Cyclobenzaprine Palpitations    Body aches,nausea,weakness   Sulfa Antibiotics Rash    VACCINATION STATUS:  There is no immunization history on file for this patient.  Diabetes She presents for her follow-up diabetic visit. She has type 2 diabetes mellitus. Onset time: She was diagnosed at approximate age of 52 years. Her disease course has been improving. There are no hypoglycemic associated symptoms. Pertinent negatives for hypoglycemia include no confusion, headaches, pallor or seizures. Pertinent negatives for diabetes include no chest pain, no fatigue, no polydipsia, no polyphagia and no polyuria. There are no hypoglycemic complications. Symptoms are improving. Diabetic complications include a CVA. Risk factors for coronary artery disease include dyslipidemia, hypertension, sedentary lifestyle and tobacco exposure. Current diabetic treatments: She is currently taking metformin  1000 mg p.o. twice daily, glipizide 5 mg p.o. daily at breakfast.  She did not tolerate Jardiance which she has after few doses. Her weight is stable. She is following a generally unhealthy diet. When asked about meal planning, she reported none. She rarely participates in exercise. Her home blood glucose trend is decreasing steadily. Her breakfast blood glucose range is generally 110-130 mg/dl. Her overall blood glucose range is 180-200 mg/dl. (She presents with admitted showing rare monitoring at fasting averaging 121.  Her point-of-care A1c 6.8% improving from 8%.   She did not develop hypoglycemia, however she worries about hypoglycemia. ) An ACE inhibitor/angiotensin II receptor blocker is being taken.  Hyperlipidemia This is a chronic problem. The problem is uncontrolled. Exacerbating diseases include diabetes. Pertinent negatives include no chest pain, myalgias or shortness of breath. Current antihyperlipidemic treatment includes statins and fibric acid derivatives. Risk factors for coronary artery disease include diabetes mellitus, dyslipidemia, family history, hypertension, a sedentary lifestyle and post-menopausal.  Hypertension This is a chronic problem. Pertinent negatives include no chest pain, headaches, palpitations or shortness of breath. Risk factors for coronary artery disease include dyslipidemia, diabetes mellitus, sedentary lifestyle, smoking/tobacco exposure and post-menopausal state. Past treatments include angiotensin blockers, beta blockers and calcium channel blockers. Hypertensive end-organ damage includes CVA.     Review of Systems  Constitutional:  Negative for chills, fatigue, fever and unexpected weight change.  HENT:  Negative for trouble swallowing and voice change.   Eyes:  Negative for visual disturbance.  Respiratory:  Negative for cough, shortness of breath and wheezing.   Cardiovascular:  Negative for chest pain, palpitations and leg swelling.  Gastrointestinal:   Negative for diarrhea, nausea and vomiting.  Endocrine: Negative for cold intolerance, heat intolerance, polydipsia, polyphagia and polyuria.  Musculoskeletal:  Negative for arthralgias and myalgias.  Skin:  Negative for color change, pallor, rash and wound.  Neurological:  Negative for seizures and headaches.  Psychiatric/Behavioral:  Negative for confusion and suicidal ideas.     Objective:       09/20/2023    8:23 AM 05/27/2023   10:27 AM 05/27/2023    9:51 AM  Vitals with BMI  Height 5\' 7"   5\' 7"   Weight 169 lbs 3 oz  168 lbs 6 oz  BMI 26.49  26.37  Systolic 126 138 409  Diastolic 76 72 84  Pulse 72  76    BP 126/76   Pulse 72   Ht 5\' 7"  (1.702 m)   Wt 169 lb 3.2 oz (76.7 kg)   BMI 26.50 kg/m   Wt Readings from Last 3 Encounters:  09/20/23 169 lb 3.2 oz (76.7 kg)  05/27/23  168 lb 6.4 oz (76.4 kg)  04/30/23 168 lb (76.2 kg)      CMP ( most recent) CMP     Component Value Date/Time   NA 139 05/21/2023 0809   K 4.2 05/21/2023 0809   CL 102 05/21/2023 0809   CO2 22 05/21/2023 0809   GLUCOSE 200 (H) 05/21/2023 0809   GLUCOSE 139 (H) 11/07/2020 1016   BUN 13 05/21/2023 0809   CREATININE 0.76 05/21/2023 0809   CALCIUM 9.7 05/21/2023 0809   PROT 6.7 05/21/2023 0809   ALBUMIN 4.4 05/21/2023 0809   AST 14 05/21/2023 0809   ALT 16 05/21/2023 0809   ALKPHOS 50 05/21/2023 0809   BILITOT 0.3 05/21/2023 0809   GFRNONAA >60 11/07/2020 1016     Diabetic Labs (most recent): Lab Results  Component Value Date   HGBA1C 6.8 09/20/2023   HGBA1C 8.0 (A) 05/27/2023   HGBA1C 7.8 (A) 02/06/2023     Lipid Panel ( most recent) Lipid Panel     Component Value Date/Time   CHOL 169 05/21/2023 0809   TRIG 173 (H) 05/21/2023 0809   HDL 41 05/21/2023 0809   CHOLHDL 4.1 05/21/2023 0809   CHOLHDL 6.4 11/08/2020 0518   VLDL UNABLE TO CALCULATE IF TRIGLYCERIDE OVER 400 mg/dL 16/03/9603 5409   LDLCALC 98 05/21/2023 0809   LDLDIRECT 82.7 11/08/2020 0518   LABVLDL 30 05/21/2023  0809      Lab Results  Component Value Date   TSH 2.670 05/21/2023   TSH 2.530 10/19/2022   TSH 1.64 07/18/2022   TSH 3.150 01/21/2017   FREET4 1.35 05/21/2023   FREET4 1.19 10/19/2022       Assessment & Plan:   1. Type 2 diabetes mellitus with hyperglycemia, without long-term current use of insulin (HCC)   - Marie Bell has currently uncontrolled symptomatic type 2 DM since  68 years of age.   She presents with admitted showing rare monitoring at fasting averaging 121.  Her point-of-care A1c 6.8% improving from 8%.   She did not develop hypoglycemia, however she worries about hypoglycemia.  Recent labs reviewed. - I had a long discussion with her about the possible risk factors and  the pathology behind its diabetes and its complications. -her diabetes is complicated by CVA, comorbid hyperlipidemia and hypertension, history of smoking and she remains at a high risk for more acute and chronic complications which include CAD, CVA, CKD, retinopathy, and neuropathy. These are all discussed in detail with her.  - I discussed all available options of managing her diabetes including de-escalation of medications. I have counseled her on Food as Medicine by adopting a Whole Food , Plant Predominant  ( WFPP) nutrition as recommended by [employer] of Lifestyle Medicine. Patient is encouraged to switch to  unprocessed or minimally processed  complex starch, adequate protein intake (mainly plant source), minimal liquid fat, plenty of fruits, and vegetables. -  she is advised to stick to a routine mealtimes to eat 3 complete meals a day and snack only when necessary ( to snack only to correct hypoglycemia BG <70 day time or <100 at night).   Her engagement with lifestyle medicine recommendation is partial, however she acknowledges there is room for her to improve.  - Suggestion is made for her to avoid simple carbohydrates  from her diet including Cakes, Sweet Desserts, Ice Cream,  Soda (diet and regular), Sweet Tea, Candies, Chips, Cookies, Store Bought Juices, Alcohol , Artificial Sweeteners,  Coffee Creamer, and "Sugar-free" Products, Lemonade.  This will help patient to have more stable blood glucose profile and potentially avoid unintended weight gain.  The following Lifestyle Medicine recommendations according to American College of Lifestyle Medicine  [facility]) were discussed and and offered to patient and she  agrees to start the journey:  A. Whole Foods, Plant-Based Nutrition comprising of fruits and vegetables, plant-based proteins, whole-grain carbohydrates was discussed in detail with the patient.   A list for source of those nutrients were also provided to the patient.  Patient will use only water or unsweetened tea for hydration. B.  The need to stay away from risky substances including alcohol, smoking; obtaining 7 to 9 hours of restorative sleep, at least 150 minutes of moderate intensity exercise weekly, the importance of healthy social connections,  and stress management techniques were discussed. C.  A full color page of  Calorie density of various food groups per pound showing examples of each food groups was provided to the patient.  - I have approached her with the following individualized plan to manage  her diabetes and patient agrees:   -She has not tolerated Jardiance from previous attempt.  She is tolerating the GLP-1 receptor agonist.  I discussed and increased her Mounjaro to 5 mg subcutaneously weekly.  Side effects and precautions discussed with her.  In light of her worry about hypoglycemia, she is advised to discontinue glipizide.  -Given her previous history of TIA ,  GLP-1 receptor agonist is preferred over SGLT2 inhibitors as an additional treatment.    -She is advised to continue metformin 1000 mg p.o. twice daily.    - Specific targets for  A1c;  LDL, HDL,  and Triglycerides were discussed with the patient.  2) Blood Pressure /Hypertension:    -Her blood pressure is controlled to target.   she is advised to continue her current medications including amlodipine 5 mg p.o. daily, bisoprolol-hydrochlorothiazide 2.5-6.25 mg p.o. twice daily, losartan 50 mg p.o. daily.    3) Lipids/Hyperlipidemia:   Review of her recent lipid panel showed LDL at 98, significantly above target.  She has mixed hyperlipidemia.  She is advised to continue atorvastatin 40 mg p.o. nightly,  fenofibrate 160 mg p.o. daily.  Whole food plant-based diet discussed above will address hyperlipidemia as well.  4)  Weight/Diet:  Body mass index is 26.5 kg/m.  -     she is not a candidate for major weight loss.   I discussed with her the fact that loss of 5 - 10% of her  current body weight will have the most impact on her diabetes management.  The above detailed  ACLM recommendations for nutrition, exercise, sleep, social life, avoidance of risky substances, the need for restorative sleep   information will also detailed on discharge instructions.  5) Chronic Care/Health Maintenance:  -she  is on ACEI/ARB and Statin medications and  is encouraged to initiate and continue to follow up with Ophthalmology, Dentist,  Podiatrist at least yearly or according to recommendations, and advised to   stay away from smoking. I have recommended yearly flu vaccine and pneumonia vaccine at least every 5 years; moderate intensity exercise for up to 150 minutes weekly; and  sleep for 7- 9 hours a day.  - she is  advised to maintain close follow up with Assunta Found, MD for primary care needs, as well as her other providers for optimal and coordinated care.   I spent  26  minutes in the care of the patient today including review of  labs from CMP, Lipids, Thyroid Function, Hematology (current and previous including abstractions from other facilities); face-to-face time discussing  her blood glucose readings/logs, discussing hypoglycemia and hyperglycemia episodes and symptoms, medications  doses, her options of short and long term treatment based on the latest standards of care / guidelines;  discussion about incorporating lifestyle medicine;  and documenting the encounter. Risk reduction counseling performed per USPSTF guidelines to reduce cardiovascular risk factors.     Please refer to Patient Instructions for Blood Glucose Monitoring and Insulin/Medications Dosing Guide"  in media tab for additional information. Please  also refer to " Patient Self Inventory" in the Media  tab for reviewed elements of pertinent patient history.  Apolinar Junes participated in the discussions, expressed understanding, and voiced agreement with the above plans.  All questions were answered to her satisfaction. she is encouraged to contact clinic should she have any questions or concerns prior to her return visit.   Follow up plan: - Return in about 4 months (around 01/20/2024) for F/U with Pre-visit Labs, Meter/CGM/Logs, A1c here.  Marquis Lunch, MD [facility] [address] [place], [place] [address] Phone: [phone]  Fax: [phone]    09/20/2023, 9:08 AM  This note was partially dictated with voice recognition software. Similar sounding words can be transcribed inadequately or may not  be corrected upon review.

## 2023-12-12 ENCOUNTER — Other Ambulatory Visit (HOSPITAL_COMMUNITY): Payer: Self-pay

## 2023-12-18 ENCOUNTER — Telehealth: Payer: Self-pay

## 2023-12-18 ENCOUNTER — Other Ambulatory Visit (HOSPITAL_COMMUNITY): Payer: Self-pay

## 2023-12-18 NOTE — Telephone Encounter (Signed)
 Pharmacy Patient Advocate Encounter   Received notification from CoverMyMeds that prior authorization for Mounjaro  5mg  is required/requested.   Insurance verification completed.   The patient is insured through [facility] .   Per test claim: PA required and submitted KEY/EOC/Request #: [id number] due to Prior Authorization Not Required.   Unable to see what the copay would be as it says too soon to refill until 01/04/24

## 2024-01-13 ENCOUNTER — Other Ambulatory Visit: Payer: Self-pay | Admitting: "Endocrinology

## 2024-01-14 LAB — LIPID PANEL
Chol/HDL Ratio: 4.5 ratio — ABNORMAL HIGH (ref 0.0–4.4)
Cholesterol, Total: 172 mg/dL (ref 100–199)
HDL: 38 mg/dL — ABNORMAL LOW (ref >39)
LDL Chol Calc (NIH): 109 mg/dL — ABNORMAL HIGH (ref 0–99)
Triglycerides: 140 mg/dL (ref 0–149)
VLDL Cholesterol Cal: 25 mg/dL (ref 5–40)

## 2024-01-14 LAB — COMPREHENSIVE METABOLIC PANEL WITH GFR
ALT: 22 IU/L (ref 0–32)
AST: 20 IU/L (ref 0–40)
Albumin: 4.3 g/dL (ref 3.9–4.9)
Alkaline Phosphatase: 43 IU/L — ABNORMAL LOW (ref 44–121)
BUN/Creatinine Ratio: 23 (ref 12–28)
BUN: 16 mg/dL (ref 8–27)
Bilirubin Total: 0.4 mg/dL (ref 0.0–1.2)
CO2: 22 mmol/L (ref 20–29)
Calcium: 9.6 mg/dL (ref 8.7–10.3)
Chloride: 103 mmol/L (ref 96–106)
Creatinine, Ser: 0.7 mg/dL (ref 0.57–1.00)
Globulin, Total: 2.3 g/dL (ref 1.5–4.5)
Glucose: 178 mg/dL — ABNORMAL HIGH (ref 70–99)
Potassium: 4.1 mmol/L (ref 3.5–5.2)
Sodium: 139 mmol/L (ref 134–144)
Total Protein: 6.6 g/dL (ref 6.0–8.5)
eGFR: 95 mL/min/1.73 (ref >59)

## 2024-01-20 ENCOUNTER — Ambulatory Visit: Admitting: "Endocrinology

## 2024-01-22 ENCOUNTER — Ambulatory Visit: Admitting: "Endocrinology

## 2024-01-22 ENCOUNTER — Encounter: Payer: Self-pay | Admitting: "Endocrinology

## 2024-01-22 VITALS — BP 136/64 | HR 80 | Ht 67.0 in | Wt 168.0 lb

## 2024-01-22 DIAGNOSIS — Z7985 Long-term (current) use of injectable non-insulin antidiabetic drugs: Secondary | ICD-10-CM

## 2024-01-22 DIAGNOSIS — E782 Mixed hyperlipidemia: Secondary | ICD-10-CM | POA: Diagnosis not present

## 2024-01-22 DIAGNOSIS — Z7984 Long term (current) use of oral hypoglycemic drugs: Secondary | ICD-10-CM | POA: Diagnosis not present

## 2024-01-22 DIAGNOSIS — E1165 Type 2 diabetes mellitus with hyperglycemia: Secondary | ICD-10-CM | POA: Diagnosis not present

## 2024-01-22 DIAGNOSIS — I10 Essential (primary) hypertension: Secondary | ICD-10-CM | POA: Diagnosis not present

## 2024-01-22 LAB — POCT GLYCOSYLATED HEMOGLOBIN (HGB A1C): HbA1c, POC (controlled diabetic range): 7.8 % — AB (ref 0.0–7.0)

## 2024-01-22 MED ORDER — TIRZEPATIDE 7.5 MG/0.5ML ~~LOC~~ SOAJ: 7.5000 mg | SUBCUTANEOUS | 1 refills | Status: DC

## 2024-01-22 NOTE — Progress Notes (Signed)
 01/22/2024, 4:55 PM  Endocrinology follow-up note   Subjective:    Patient ID: Marie Bell, female    DOB: Dec 31, 1955.  Marie Bell is being seen in follow-up after she was seen in consultation for management of currently uncontrolled symptomatic diabetes requested by  Marvine Rush, MD.   Past Medical History:  Diagnosis Date   DM (diabetes mellitus) (HCC)    Hyperlipidemia    Hypertension     Past Surgical History:  Procedure Laterality Date   ABDOMINAL HYSTERECTOMY     CHOLECYSTECTOMY     COLONOSCOPY WITH PROPOFOL  N/A 04/30/2023   Procedure: COLONOSCOPY WITH PROPOFOL ;  Surgeon: Mavis Anes, MD;  Location: [facility];  Service: Gastroenterology;  Laterality: N/A;    Social History   Socioeconomic History   Marital status: Married    Spouse name: Not on file   Number of children: 2   Years of education: Not on file   Highest education level: Not on file  Occupational History   Occupation: HR  Tobacco Use   Smoking status: Former    Types: E-cigarettes    Quit date: 08/2023    Years since quitting: 0.4   Smokeless tobacco: Never  Vaping Use   Vaping status: Former  Substance and Sexual Activity   Alcohol use: No   Drug use: No   Sexual activity: Not on file  Other Topics Concern   Not on file  Social History Narrative   Not on file   Social Drivers of Health   Financial Resource Strain: Not on file  Food Insecurity: Not on file  Transportation Needs: Not on file  Physical Activity: Not on file  Stress: Not on file  Social Connections: Not on file    Family History  Problem Relation Age of Onset   Hypertension Mother    Arthritis Mother    Liver disease Father    Alcoholism Father    Diabetes Maternal Grandmother    Colon cancer Maternal Grandfather     Outpatient Encounter Medications as of 01/22/2024  Medication Sig   tirzepatide  (MOUNJARO ) 7.5  MG/0.5ML Pen Inject 7.5 mg into the skin once a week.   amLODipine (NORVASC) 5 MG tablet Take 5 mg by mouth daily.   atorvastatin  (LIPITOR) 40 MG tablet TAKE 1 TABLET(40 MG) BY MOUTH DAILY   bisoprolol -hydrochlorothiazide  (ZIAC ) 2.5-6.25 MG tablet Take 1 tablet by mouth daily.   fenofibrate  160 MG tablet Take 1 tablet (160 mg total) by mouth daily.   losartan  (COZAAR ) 50 MG tablet Take 100 mg by mouth daily.   metFORMIN  (GLUCOPHAGE ) 1000 MG tablet TAKE 1 TABLET(1000 MG) BY MOUTH TWICE DAILY WITH A MEAL   OVER THE COUNTER MEDICATION CBD gummie daily   [DISCONTINUED] tirzepatide  (MOUNJARO ) 5 MG/0.5ML Pen Inject 5 mg into the skin once a week.   No facility-administered encounter medications on file as of 01/22/2024.    ALLERGIES: Allergies  Allergen Reactions   Simvastatin Other (See Comments)    Leg cramps   Jardiance  [Empagliflozin ]    Penicillins    Augmentin [Amoxicillin-Pot Clavulanate] Rash   Cyclobenzaprine Palpitations  Body aches,nausea,weakness   Sulfa Antibiotics Rash    VACCINATION STATUS:  There is no immunization history on file for this patient.  Diabetes She presents for her follow-up diabetic visit. She has type 2 diabetes mellitus. Onset time: She was diagnosed at approximate age of 52 years. Her disease course has been worsening. There are no hypoglycemic associated symptoms. Pertinent negatives for hypoglycemia include no confusion, headaches, pallor or seizures. Pertinent negatives for diabetes include no chest pain, no fatigue, no polydipsia, no polyphagia and no polyuria. There are no hypoglycemic complications. Symptoms are worsening. Diabetic complications include a CVA. Risk factors for coronary artery disease include dyslipidemia, hypertension, sedentary lifestyle and tobacco exposure. Her weight is fluctuating minimally. She is following a generally unhealthy diet. When asked about meal planning, she reported none. She rarely participates in exercise. Her home  blood glucose trend is increasing steadily. Her breakfast blood glucose range is generally 130-140 mg/dl. Her bedtime blood glucose range is generally 140-180 mg/dl. Her overall blood glucose range is 140-180 mg/dl. (She presents with hypoglycemic profile and point-of-care A1c of 7.8% increasing from 6.8%.    She did not develop hypoglycemia, was taken off of glipizide  during her last visit. ) An ACE inhibitor/angiotensin II receptor blocker is being taken.  Hyperlipidemia This is a chronic problem. The problem is uncontrolled. Exacerbating diseases include diabetes. Pertinent negatives include no chest pain or shortness of breath. Current antihyperlipidemic treatment includes statins and fibric acid derivatives. Risk factors for coronary artery disease include diabetes mellitus, dyslipidemia, family history, hypertension, a sedentary lifestyle and post-menopausal.  Hypertension This is a chronic problem. Pertinent negatives include no chest pain, headaches, palpitations or shortness of breath. Risk factors for coronary artery disease include dyslipidemia, diabetes mellitus, sedentary lifestyle, smoking/tobacco exposure and post-menopausal state. Past treatments include angiotensin blockers, beta blockers and calcium  channel blockers. Hypertensive end-organ damage includes CVA.     Objective:       01/22/2024    2:32 PM 09/20/2023    8:23 AM 05/27/2023   10:27 AM  Vitals with BMI  Height 5' 7 5' 7   Weight 168 lbs 169 lbs 3 oz   BMI 26.31 26.49   Systolic 136 126 861  Diastolic 64 76 72  Pulse 80 72     BP 136/64   Pulse 80   Ht 5' 7 (1.702 m)   Wt 168 lb (76.2 kg)   BMI 26.31 kg/m   Wt Readings from Last 3 Encounters:  01/22/24 168 lb (76.2 kg)  09/20/23 169 lb 3.2 oz (76.7 kg)  05/27/23 168 lb 6.4 oz (76.4 kg)      CMP ( most recent) CMP     Component Value Date/Time   NA 139 01/13/2024 0819   K 4.1 01/13/2024 0819   CL 103 01/13/2024 0819   CO2 22 01/13/2024 0819    GLUCOSE 178 (H) 01/13/2024 0819   GLUCOSE 139 (H) 11/07/2020 1016   BUN 16 01/13/2024 0819   CREATININE 0.70 01/13/2024 0819   CALCIUM  9.6 01/13/2024 0819   PROT 6.6 01/13/2024 0819   ALBUMIN 4.3 01/13/2024 0819   AST 20 01/13/2024 0819   ALT 22 01/13/2024 0819   ALKPHOS 43 (L) 01/13/2024 0819   BILITOT 0.4 01/13/2024 0819   GFRNONAA >60 11/07/2020 1016     Diabetic Labs (most recent): Lab Results  Component Value Date   HGBA1C 7.8 (A) 01/22/2024   HGBA1C 6.8 09/20/2023   HGBA1C 8.0 (A) 05/27/2023     Lipid Panel (  most recent) Lipid Panel     Component Value Date/Time   CHOL 172 01/13/2024 0819   TRIG 140 01/13/2024 0819   HDL 38 (L) 01/13/2024 0819   CHOLHDL 4.5 (H) 01/13/2024 0819   CHOLHDL 6.4 11/08/2020 0518   VLDL UNABLE TO CALCULATE IF TRIGLYCERIDE OVER 400 mg/dL 94/75/7977 9481   LDLCALC 109 (H) 01/13/2024 0819   LDLDIRECT 82.7 11/08/2020 0518   LABVLDL 25 01/13/2024 0819      Lab Results  Component Value Date   TSH 2.670 05/21/2023   TSH 2.530 10/19/2022   TSH 1.64 07/18/2022   TSH 3.150 01/21/2017   FREET4 1.35 05/21/2023   FREET4 1.19 10/19/2022       Assessment & Plan:   1. Type 2 diabetes mellitus with hyperglycemia, without long-term current use of insulin  (HCC)   - Marie Bell has currently uncontrolled symptomatic type 2 DM since  68 years of age.  She presents with hypoglycemic profile and point-of-care A1c of 7.8% increasing from 6.8%.   she did not develop hypoglycemia, was taken off of glipizide  during her last visit.  Recent labs reviewed. - I had a long discussion with her about the possible risk factors and  the pathology behind its diabetes and its complications. -her diabetes is complicated by CVA, comorbid hyperlipidemia and hypertension, history of smoking and she remains at a high risk for more acute and chronic complications which include CAD, CVA, CKD, retinopathy, and neuropathy. These are all discussed in detail with  her.  - I discussed all available options of managing her diabetes including de-escalation of medications. I have counseled her on Food as Medicine by adopting a Whole Food , Plant Predominant  ( WFPP) nutrition as recommended by [employer] of Lifestyle Medicine. Patient is encouraged to switch to  unprocessed or minimally processed  complex starch, adequate protein intake (mainly plant source), minimal liquid fat, plenty of fruits, and vegetables. -  she is advised to stick to a routine mealtimes to eat 3 complete meals a day and snack only when necessary ( to snack only to correct hypoglycemia BG <70 day time or <100 at night).   Her engagement with lifestyle medicine recommendation is partial, however she acknowledges there is room for her to improve.  - Suggestion is made for her to avoid simple carbohydrates  from her diet including Cakes, Sweet Desserts, Ice Cream, Soda (diet and regular), Sweet Tea, Candies, Chips, Cookies, Store Bought Juices, Alcohol , Artificial Sweeteners,  Coffee Creamer, and Sugar-free Products, Lemonade. This will help patient to have more stable blood glucose profile and potentially avoid unintended weight gain.   - I have approached her with the following individualized plan to manage  her diabetes and patient agrees:   -She has not tolerated Jardiance  from previous attempt.  She is tolerating the GLP-1 receptor agonist.  I discussed and increase her Mounjaro  to 7.5 mg subcutaneously weekly.   Side effects and precautions discussed with her.  In light of her worry about hypoglycemia, she is advised to stay off of glipizide .  -She did have intolerance to SGLT2 inhibitors.    -She is advised to continue metformin  1000 mg p.o. twice daily.    - Specific targets for  A1c;  LDL, HDL,  and Triglycerides were discussed with the patient.  2) Blood Pressure /Hypertension:   - Her blood pressure is controlled to target.   she is advised to continue her current  medications including amlodipine 5 mg p.o. daily, bisoprolol -hydrochlorothiazide   2.5-6.25 mg p.o. twice daily, losartan  50 mg p.o. daily.    3) Lipids/Hyperlipidemia:   Review of her recent lipid panel showed LDL increasing to 109.  She has mixed hyperlipidemia.  She is advised to continue atorvastatin  40 mg p.o. nightly,  fenofibrate  160 mg p.o. daily.  Whole food plant-based diet discussed above will address hyperlipidemia as well.  4)  Weight/Diet:  Body mass index is 26.31 kg/m.  -     she is not a candidate for major weight loss.   I discussed with her the fact that loss of 5 - 10% of her  current body weight will have the most impact on her diabetes management.  The above detailed  [facility] recommendations for nutrition, exercise, sleep, social life, avoidance of risky substances, the need for restorative sleep   information will also detailed on discharge instructions.  5) Chronic Care/Health Maintenance:  -she  is on ACEI/ARB and Statin medications and  is encouraged to initiate and continue to follow up with Ophthalmology, Dentist,  Podiatrist at least yearly or according to recommendations, and advised to   stay away from smoking. I have recommended yearly flu vaccine and pneumonia vaccine at least every 5 years; moderate intensity exercise for up to 150 minutes weekly; and  sleep for 7- 9 hours a day.  - she is  advised to maintain close follow up with Marvine Rush, MD for primary care needs, as well as her other providers for optimal and coordinated care.  I spent  26  minutes in the care of the patient today including review of labs from CMP, Lipids, Thyroid  Function, Hematology (current and previous including abstractions from other facilities); face-to-face time discussing  her blood glucose readings/logs, discussing hypoglycemia and hyperglycemia episodes and symptoms, medications doses, her options of short and long term treatment based on the latest standards of care / guidelines;   discussion about incorporating lifestyle medicine;  and documenting the encounter. Risk reduction counseling performed per USPSTF guidelines to reduce  cardiovascular risk factors.     Please refer to Patient Instructions for Blood Glucose Monitoring and Insulin /Medications Dosing Guide  in media tab for additional information. Please  also refer to  Patient Self Inventory in the Media  tab for reviewed elements of pertinent patient history.  Marie Bell participated in the discussions, expressed understanding, and voiced agreement with the above plans.  All questions were answered to her satisfaction. she is encouraged to contact clinic should she have any questions or concerns prior to her return visit.    Follow up plan: - Return in about 6 months (around 07/24/2024) for Bring Meter/CGM Device/Logs- A1c in Office.  Ranny Earl, MD [facility] [address] [place], [place] [address] Phone: [phone]  Fax: [phone]    01/22/2024, 4:55 PM  This note was partially dictated with voice recognition software. Similar sounding words can be transcribed inadequately or may not  be corrected upon review.

## 2024-01-22 NOTE — Patient Instructions (Signed)

## 2024-03-05 ENCOUNTER — Other Ambulatory Visit: Payer: Self-pay | Admitting: "Endocrinology

## 2024-03-31 ENCOUNTER — Other Ambulatory Visit: Payer: Self-pay | Admitting: "Endocrinology

## 2024-05-26 ENCOUNTER — Other Ambulatory Visit: Payer: Self-pay | Admitting: "Endocrinology

## 2024-07-01 ENCOUNTER — Other Ambulatory Visit: Payer: Self-pay | Admitting: "Endocrinology

## 2024-07-01 ENCOUNTER — Telehealth: Payer: Self-pay | Admitting: "Endocrinology

## 2024-07-01 MED ORDER — OZEMPIC (0.25 OR 0.5 MG/DOSE) 2 MG/3ML ~~LOC~~ SOPN: 0.2500 mg | PEN_INJECTOR | SUBCUTANEOUS | 0 refills | Status: DC

## 2024-07-01 NOTE — Telephone Encounter (Signed)
 Patients copay for Mounjaro  is $200. Can you send in an alternative?

## 2024-07-06 NOTE — Telephone Encounter (Signed)
 Will defer to Dr. Lenis when he returns tomorrow.  Not sure the route he would take next.  Based on his OV note, the Glipizide  caused hypoglycemia.  Not sure if he wants to restart that or put her on a different medication entirely.

## 2024-07-09 ENCOUNTER — Other Ambulatory Visit: Payer: Self-pay | Admitting: "Endocrinology

## 2024-07-09 MED ORDER — AMLODIPINE BESYLATE 5 MG PO TABS: 5.0000 mg | ORAL_TABLET | Freq: Every day | ORAL | 0 refills | Status: AC

## 2024-07-17 ENCOUNTER — Telehealth: Payer: Self-pay

## 2024-07-17 NOTE — Telephone Encounter (Signed)
 Spoke with pt advising her that Metformin  1500mg  bid is excessive and unsafe per Dr. Lenis. Advised pt to lower her Metformin  to 1000mg  bid and we can add Glipizide  if she continues to experience difficulty maintaining control of her glucose per Dr. Lenis. Pt has an appt on 07/24/24. Pt will continue to monitor glucose over the next 2-3 days and call back with readings. Advised pt to call after hours nurse line if needed over the weekend and they will contact the provider.  Pt voiced understanding.

## 2024-07-17 NOTE — Telephone Encounter (Signed)
 Pt called stating she had to discontinue Mounjaro  due to increased copay, and Ozempic  also unaffordable. States she increased her Metformin  to 1500mg  bid last week. States her BG readings have been between 155 -179 in the mornings before breakfast and 154-166 in the evenings before bed.

## 2024-07-24 ENCOUNTER — Ambulatory Visit: Admitting: "Endocrinology

## 2024-07-24 ENCOUNTER — Encounter: Payer: Self-pay | Admitting: "Endocrinology

## 2024-07-24 VITALS — BP 144/76 | HR 60 | Ht 67.0 in | Wt 164.2 lb

## 2024-07-24 DIAGNOSIS — I10 Essential (primary) hypertension: Secondary | ICD-10-CM

## 2024-07-24 DIAGNOSIS — E782 Mixed hyperlipidemia: Secondary | ICD-10-CM

## 2024-07-24 DIAGNOSIS — Z7984 Long term (current) use of oral hypoglycemic drugs: Secondary | ICD-10-CM

## 2024-07-24 DIAGNOSIS — E1165 Type 2 diabetes mellitus with hyperglycemia: Secondary | ICD-10-CM

## 2024-07-24 LAB — POCT GLYCOSYLATED HEMOGLOBIN (HGB A1C): HbA1c, POC (controlled diabetic range): 8.1 % — AB (ref 0.0–7.0)

## 2024-07-24 NOTE — Patient Instructions (Signed)

## 2024-07-24 NOTE — Progress Notes (Signed)
 "                                                                                     07/24/2024, 12:17 PM  Endocrinology follow-up note   Subjective:    Patient ID: Marie Bell, female    DOB: 01-03-1956.  Marie Bell is being seen in follow-up after she was seen in consultation for management of currently uncontrolled symptomatic diabetes requested by  Marvine Rush, MD.   Past Medical History:  Diagnosis Date   DM (diabetes mellitus) (HCC)    Hyperlipidemia    Hypertension     Past Surgical History:  Procedure Laterality Date   ABDOMINAL HYSTERECTOMY     CHOLECYSTECTOMY     COLONOSCOPY WITH PROPOFOL  N/A 04/30/2023   Procedure: COLONOSCOPY WITH PROPOFOL ;  Surgeon: Mavis Anes, MD;  Location: [facility];  Service: Gastroenterology;  Laterality: N/A;    Social History   Socioeconomic History   Marital status: Married    Spouse name: Not on file   Number of children: 2   Years of education: Not on file   Highest education level: Not on file  Occupational History   Occupation: HR  Tobacco Use   Smoking status: Former    Types: E-cigarettes    Quit date: 08/2023    Years since quitting: 0.9   Smokeless tobacco: Never  Vaping Use   Vaping status: Former  Substance and Sexual Activity   Alcohol use: No   Drug use: No   Sexual activity: Not on file  Other Topics Concern   Not on file  Social History Narrative   Not on file   Social Drivers of Health   Tobacco Use: Medium Risk (07/24/2024)   Patient History    Smoking Tobacco Use: Former    Smokeless Tobacco Use: Never    Passive Exposure: Not on Actuary Strain: Not on file  Food Insecurity: Not on file  Transportation Needs: Not on file  Physical Activity: Not on file  Stress: Not on file  Social Connections: Not on file  Depression (PHQ2-9): Not on file  Alcohol Screen: Not on file  Housing: Not on file  Utilities: Not on file  Health Literacy: Not on file    Family History   Problem Relation Age of Onset   Hypertension Mother    Arthritis Mother    Liver disease Father    Alcoholism Father    Diabetes Maternal Grandmother    Colon cancer Maternal Grandfather     Outpatient Encounter Medications as of 07/24/2024  Medication Sig   glipiZIDE  (GLUCOTROL  XL) 2.5 MG 24 hr tablet Take 2.5 mg by mouth daily with breakfast.   amLODipine  (NORVASC ) 5 MG tablet Take 1 tablet (5 mg total) by mouth daily.   atorvastatin  (LIPITOR) 40 MG tablet TAKE 1 TABLET(40 MG) BY MOUTH DAILY   bisoprolol -hydrochlorothiazide  (ZIAC ) 2.5-6.25 MG tablet Take 1 tablet by mouth daily.   fenofibrate  160 MG tablet Take 1 tablet (160 mg total) by mouth daily.   losartan  (COZAAR ) 50 MG tablet Take 100 mg by mouth daily.   metFORMIN  (GLUCOPHAGE ) 1000 MG tablet TAKE 1 TABLET(1000  MG) BY MOUTH TWICE DAILY WITH A MEAL   OVER THE COUNTER MEDICATION CBD gummie daily   [DISCONTINUED] Semaglutide ,0.25 or 0.5MG /DOS, (OZEMPIC , 0.25 OR 0.5 MG/DOSE,) 2 MG/3ML SOPN Inject 0.25 mg into the skin once a week.   No facility-administered encounter medications on file as of 07/24/2024.    ALLERGIES: Allergies  Allergen Reactions   Simvastatin Other (See Comments)    Leg cramps   Jardiance  [Empagliflozin ]    Penicillins    Augmentin [Amoxicillin-Pot Clavulanate] Rash   Cyclobenzaprine Palpitations    Body aches,nausea,weakness   Sulfa Antibiotics Rash    VACCINATION STATUS:  There is no immunization history on file for this patient.  Diabetes She presents for her follow-up diabetic visit. She has type 2 diabetes mellitus. Onset time: She was diagnosed at approximate age of 52 years. Her disease course has been worsening. There are no hypoglycemic associated symptoms. Pertinent negatives for hypoglycemia include no confusion, headaches, pallor or seizures. Pertinent negatives for diabetes include no chest pain, no fatigue, no polydipsia, no polyphagia and no polyuria. There are no hypoglycemic  complications. Symptoms are worsening. Diabetic complications include a CVA. Risk factors for coronary artery disease include dyslipidemia, hypertension, sedentary lifestyle and tobacco exposure. Her weight is fluctuating minimally. She is following a generally unhealthy diet. When asked about meal planning, she reported none. She rarely participates in exercise. Her home blood glucose trend is increasing steadily. Her breakfast blood glucose range is generally 140-180 mg/dl. Her bedtime blood glucose range is generally 180-200 mg/dl. Her overall blood glucose range is 180-200 mg/dl. (She presents with above target glycemic profile and point-of-care A1c of 8.1% improving from 7.8% during her last visit.  She did not afford the GLP-1 receptor agonists.  She does not have current hypoglycemic episodes.) An ACE inhibitor/angiotensin II receptor blocker is being taken.  Hyperlipidemia This is a chronic problem. The problem is uncontrolled. Exacerbating diseases include diabetes. Pertinent negatives include no chest pain or shortness of breath. Current antihyperlipidemic treatment includes statins and fibric acid derivatives. Risk factors for coronary artery disease include diabetes mellitus, dyslipidemia, family history, hypertension, a sedentary lifestyle and post-menopausal.  Hypertension This is a chronic problem. Pertinent negatives include no chest pain, headaches, palpitations or shortness of breath. Risk factors for coronary artery disease include dyslipidemia, diabetes mellitus, sedentary lifestyle, smoking/tobacco exposure and post-menopausal state. Past treatments include angiotensin blockers, beta blockers and calcium  channel blockers. Hypertensive end-organ damage includes CVA.     Objective:       07/24/2024   10:41 AM 01/22/2024    2:32 PM 09/20/2023    8:23 AM  Vitals with BMI  Height 5' 7 5' 7 5' 7  Weight 164 lbs 3 oz 168 lbs 169 lbs 3 oz  BMI 25.71 26.31 26.49  Systolic 144 136 873   Diastolic 76 64 76  Pulse 60 80 72    BP (!) 144/76   Pulse 60   Ht 5' 7 (1.702 m)   Wt 164 lb 3.2 oz (74.5 kg)   BMI 25.72 kg/m   Wt Readings from Last 3 Encounters:  07/24/24 164 lb 3.2 oz (74.5 kg)  01/22/24 168 lb (76.2 kg)  09/20/23 169 lb 3.2 oz (76.7 kg)      CMP ( most recent) CMP     Component Value Date/Time   NA 139 01/13/2024 0819   K 4.1 01/13/2024 0819   CL 103 01/13/2024 0819   CO2 22 01/13/2024 0819   GLUCOSE 178 (H) 01/13/2024 9180  GLUCOSE 139 (H) 11/07/2020 1016   BUN 16 01/13/2024 0819   CREATININE 0.70 01/13/2024 0819   CALCIUM  9.6 01/13/2024 0819   PROT 6.6 01/13/2024 0819   ALBUMIN 4.3 01/13/2024 0819   AST 20 01/13/2024 0819   ALT 22 01/13/2024 0819   ALKPHOS 43 (L) 01/13/2024 0819   BILITOT 0.4 01/13/2024 0819   GFRNONAA >60 11/07/2020 1016     Diabetic Labs (most recent): Lab Results  Component Value Date   HGBA1C 8.1 (A) 07/24/2024   HGBA1C 7.8 (A) 01/22/2024   HGBA1C 6.8 09/20/2023     Lipid Panel ( most recent) Lipid Panel     Component Value Date/Time   CHOL 172 01/13/2024 0819   TRIG 140 01/13/2024 0819   HDL 38 (L) 01/13/2024 0819   CHOLHDL 4.5 (H) 01/13/2024 0819   CHOLHDL 6.4 11/08/2020 0518   VLDL UNABLE TO CALCULATE IF TRIGLYCERIDE OVER 400 mg/dL 94/75/7977 9481   LDLCALC 109 (H) 01/13/2024 0819   LDLDIRECT 82.7 11/08/2020 0518   LABVLDL 25 01/13/2024 0819      Lab Results  Component Value Date   TSH 2.670 05/21/2023   TSH 2.530 10/19/2022   TSH 1.64 07/18/2022   TSH 3.150 01/21/2017   FREET4 1.35 05/21/2023   FREET4 1.19 10/19/2022       Assessment & Plan:   1. Type 2 diabetes mellitus with hyperglycemia, without long-term current use of insulin  (HCC)   - Marie Bell has currently uncontrolled symptomatic type 2 DM since  69 years of age.  She presents with above target glycemic profile and point-of-care A1c of 8.1% improving from 7.8% during her last visit.  She did not afford the GLP-1  receptor agonists.  She does not have current hypoglycemic episodes.  Recent labs reviewed. - I had a long discussion with her about the possible risk factors and  the pathology behind its diabetes and its complications. -her diabetes is complicated by CVA, comorbid hyperlipidemia and hypertension, history of smoking and she remains at a high risk for more acute and chronic complications which include CAD, CVA, CKD, retinopathy, and neuropathy. These are all discussed in detail with her.  - I discussed all available options of managing her diabetes including de-escalation of medications. I have counseled her on Food as Medicine by adopting a Whole Food , Plant Predominant  ( WFPP) nutrition as recommended by [employer] of Lifestyle Medicine. Patient is encouraged to switch to  unprocessed or minimally processed  complex starch, adequate protein intake (mainly plant source), minimal liquid fat, plenty of fruits, and vegetables. -  she is advised to stick to a routine mealtimes to eat 3 complete meals a day and snack only when necessary ( to snack only to correct hypoglycemia BG <70 day time or <100 at night).   Her engagement with lifestyle medicine recommendation is partial, however she acknowledges there is room for her to improve.  - Suggestion is made for her to avoid simple carbohydrates  from her diet including Cakes, Sweet Desserts, Ice Cream, Soda (diet and regular), Sweet Tea, Candies, Chips, Cookies, Store Bought Juices, Alcohol , Artificial Sweeteners,  Coffee Creamer, and Sugar-free Products, Lemonade. This will help patient to have more stable blood glucose profile and potentially avoid unintended weight gain.   - I have approached her with the following individualized plan to manage  her diabetes and patient agrees:   -She has not tolerated Jardiance  from previous attempt.  Her insurance is not providing enough coverage for  GLP-1 receptor agonists.  She will need additional  intervention to control glycemia.  I discussed and reinitiated glipizide  2.5 mg XL p.o. daily at breakfast along with metformin  1000 g p.o. twice daily.   She is willing and advised to monitor blood glucose twice a day-daily before breakfast and at bedtime.  She is encouraged to call clinic for hypoglycemia under 70 or hyperglycemia above 200 mg per DL.   - Specific targets for  A1c;  LDL, HDL,  and Triglycerides were discussed with the patient.  2) Blood Pressure /Hypertension:   Her blood pressure is not controlled to target.   she is advised to continue her current medications including amlodipine  5 mg p.o. daily, bisoprolol -hydrochlorothiazide  2.5-6.25 mg p.o. twice daily, losartan  50 mg p.o. daily.    3) Lipids/Hyperlipidemia:   Review of her recent lipid panel showed LDL increasing to 109.  She has mixed hyperlipidemia.  She is advised to continue atorvastatin  40 mg p.o. nightly, fenofibrate  160 mg p.o. daily.  Whole food plant-based diet discussed above will address hyperlipidemia as well.  4)  Weight/Diet:  Body mass index is 25.72 kg/m.  -     she is not a candidate for major weight loss.   I discussed with her the fact that loss of 5 - 10% of her  current body weight will have the most impact on her diabetes management.  The above detailed  [facility] recommendations for nutrition, exercise, sleep, social life, avoidance of risky substances, the need for restorative sleep   information will also detailed on discharge instructions.  5) Chronic Care/Health Maintenance:  -she  is on ACEI/ARB and Statin medications and  is encouraged to initiate and continue to follow up with Ophthalmology, Dentist,  Podiatrist at least yearly or according to recommendations, and advised to   stay away from smoking. I have recommended yearly flu vaccine and pneumonia vaccine at least every 5 years; moderate intensity exercise for up to 150 minutes weekly; and  sleep for 7- 9 hours a day.  - she is  advised to  maintain close follow up with Marvine Rush, MD for primary care needs, as well as her other providers for optimal and coordinated care.   I spent  25  minutes in the care of the patient today including review of labs from CMP, Lipids, Thyroid  Function, Hematology (current and previous including abstractions from other facilities); face-to-face time discussing  her blood glucose readings/logs, discussing hypoglycemia and hyperglycemia episodes and symptoms, medications doses, her options of short and long term treatment based on the latest standards of care / guidelines;  discussion about incorporating lifestyle medicine;  and documenting the encounter. Risk reduction counseling performed per USPSTF guidelines to reduce  cardiovascular risk factors.     Please refer to Patient Instructions for Blood Glucose Monitoring and Insulin /Medications Dosing Guide  in media tab for additional information. Please  also refer to  Patient Self Inventory in the Media  tab for reviewed elements of pertinent patient history.  Marie Bell participated in the discussions, expressed understanding, and voiced agreement with the above plans.  All questions were answered to her satisfaction. she is encouraged to contact clinic should she have any questions or concerns prior to her return visit.  Dear Patient: Feel free to review your progress notes.  If you are reviewing this progress note and have questions about the meaning of /or medical terms being used, please make a note and address it at your next follow-up appointment.  Medical notes are meant to be a communication tool between medical professionals and require medical terms to be used for efficiency and insurance approval.     Follow up plan: - Return in about 6 months (around 01/21/2025) for F/U with Pre-visit Labs, Meter/CGM/Logs, A1c here.  Ranny Earl, MD [facility] [address] [place],  [place] [address] Phone: [phone]  Fax: [phone]    07/24/2024, 12:17 PM  This note was partially dictated with voice recognition software. Similar sounding words can be transcribed inadequately or may not  be corrected upon review.  "

## 2025-01-21 ENCOUNTER — Ambulatory Visit: Admitting: "Endocrinology
